# Patient Record
Sex: Female | Born: 1955 | Race: White | Hispanic: No | Marital: Married | Smoking: Never smoker
Health system: Southern US, Community
[De-identification: ages and names within clinical notes are randomized; demographics above are authoritative.]

## PROBLEM LIST (undated history)

## (undated) DIAGNOSIS — N6099 Unspecified benign mammary dysplasia of unspecified breast: Secondary | ICD-10-CM

## (undated) DIAGNOSIS — C44311 Basal cell carcinoma of skin of nose: Secondary | ICD-10-CM

## (undated) DIAGNOSIS — C50919 Malignant neoplasm of unspecified site of unspecified female breast: Secondary | ICD-10-CM

## (undated) DIAGNOSIS — K219 Gastro-esophageal reflux disease without esophagitis: Secondary | ICD-10-CM

## (undated) DIAGNOSIS — T4145XA Adverse effect of unspecified anesthetic, initial encounter: Secondary | ICD-10-CM

## (undated) DIAGNOSIS — Z87828 Personal history of other (healed) physical injury and trauma: Secondary | ICD-10-CM

## (undated) DIAGNOSIS — T8859XA Other complications of anesthesia, initial encounter: Secondary | ICD-10-CM

## (undated) DIAGNOSIS — G43909 Migraine, unspecified, not intractable, without status migrainosus: Secondary | ICD-10-CM

## (undated) DIAGNOSIS — C4401 Basal cell carcinoma of skin of lip: Secondary | ICD-10-CM

## (undated) DIAGNOSIS — F32A Depression, unspecified: Secondary | ICD-10-CM

## (undated) DIAGNOSIS — F329 Major depressive disorder, single episode, unspecified: Secondary | ICD-10-CM

## (undated) HISTORY — PX: LUMBAR MICRODISCECTOMY: SHX99

## (undated) HISTORY — PX: BASAL CELL CARCINOMA EXCISION: SHX1214

---
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
## 1988-08-18 HISTORY — PX: AXILLARY LYMPH NODE DISSECTION: SHX5229

## 1997-10-27 ENCOUNTER — Ambulatory Visit (HOSPITAL_COMMUNITY): Admission: RE | Admit: 1997-10-27 | Discharge: 1997-10-27 | Payer: Self-pay | Admitting: Obstetrics and Gynecology

## 1997-11-06 ENCOUNTER — Ambulatory Visit (HOSPITAL_COMMUNITY): Admission: RE | Admit: 1997-11-06 | Discharge: 1997-11-06 | Payer: Self-pay | Admitting: Obstetrics and Gynecology

## 1997-12-01 ENCOUNTER — Ambulatory Visit (HOSPITAL_COMMUNITY): Admission: RE | Admit: 1997-12-01 | Discharge: 1997-12-01 | Payer: Self-pay | Admitting: Surgery

## 1998-04-04 ENCOUNTER — Ambulatory Visit (HOSPITAL_COMMUNITY): Admission: RE | Admit: 1998-04-04 | Discharge: 1998-04-05 | Payer: Self-pay | Admitting: Specialist

## 1998-04-04 ENCOUNTER — Encounter: Payer: Self-pay | Admitting: Specialist

## 1998-08-18 DIAGNOSIS — C4401 Basal cell carcinoma of skin of lip: Secondary | ICD-10-CM

## 1998-08-18 HISTORY — DX: Basal cell carcinoma of skin of lip: C44.01

## 1999-05-13 ENCOUNTER — Other Ambulatory Visit: Admission: RE | Admit: 1999-05-13 | Discharge: 1999-05-13 | Payer: Self-pay | Admitting: Obstetrics and Gynecology

## 2000-07-21 ENCOUNTER — Other Ambulatory Visit: Admission: RE | Admit: 2000-07-21 | Discharge: 2000-07-21 | Payer: Self-pay | Admitting: Obstetrics and Gynecology

## 2000-07-30 ENCOUNTER — Other Ambulatory Visit: Admission: RE | Admit: 2000-07-30 | Discharge: 2000-07-30 | Payer: Self-pay | Admitting: Obstetrics and Gynecology

## 2000-07-30 ENCOUNTER — Encounter (INDEPENDENT_AMBULATORY_CARE_PROVIDER_SITE_OTHER): Payer: Self-pay

## 2000-12-24 ENCOUNTER — Encounter: Payer: Self-pay | Admitting: Obstetrics and Gynecology

## 2000-12-24 ENCOUNTER — Ambulatory Visit (HOSPITAL_COMMUNITY): Admission: RE | Admit: 2000-12-24 | Discharge: 2000-12-24 | Payer: Self-pay | Admitting: Obstetrics and Gynecology

## 2001-03-06 ENCOUNTER — Ambulatory Visit (HOSPITAL_COMMUNITY): Admission: RE | Admit: 2001-03-06 | Discharge: 2001-03-06 | Payer: Self-pay | Admitting: Family Medicine

## 2001-03-06 ENCOUNTER — Encounter: Payer: Self-pay | Admitting: Family Medicine

## 2002-01-20 ENCOUNTER — Ambulatory Visit (HOSPITAL_COMMUNITY): Admission: RE | Admit: 2002-01-20 | Discharge: 2002-01-20 | Payer: Self-pay | Admitting: Obstetrics and Gynecology

## 2002-01-20 ENCOUNTER — Encounter: Payer: Self-pay | Admitting: Obstetrics and Gynecology

## 2003-10-05 ENCOUNTER — Other Ambulatory Visit: Admission: RE | Admit: 2003-10-05 | Discharge: 2003-10-05 | Payer: Self-pay | Admitting: Obstetrics and Gynecology

## 2003-12-26 ENCOUNTER — Ambulatory Visit (HOSPITAL_COMMUNITY): Admission: RE | Admit: 2003-12-26 | Discharge: 2003-12-26 | Payer: Self-pay | Admitting: Obstetrics and Gynecology

## 2004-10-07 ENCOUNTER — Other Ambulatory Visit: Admission: RE | Admit: 2004-10-07 | Discharge: 2004-10-07 | Payer: Self-pay | Admitting: Obstetrics and Gynecology

## 2005-01-08 ENCOUNTER — Ambulatory Visit (HOSPITAL_COMMUNITY): Admission: RE | Admit: 2005-01-08 | Discharge: 2005-01-08 | Payer: Self-pay | Admitting: Obstetrics and Gynecology

## 2005-10-07 ENCOUNTER — Other Ambulatory Visit: Admission: RE | Admit: 2005-10-07 | Discharge: 2005-10-07 | Payer: Self-pay | Admitting: Obstetrics and Gynecology

## 2006-01-13 ENCOUNTER — Encounter: Admission: RE | Admit: 2006-01-13 | Discharge: 2006-01-13 | Payer: Self-pay | Admitting: Obstetrics and Gynecology

## 2006-10-02 ENCOUNTER — Encounter: Admission: RE | Admit: 2006-10-02 | Discharge: 2006-10-02 | Payer: Self-pay | Admitting: Family Medicine

## 2007-02-08 ENCOUNTER — Ambulatory Visit (HOSPITAL_COMMUNITY): Admission: RE | Admit: 2007-02-08 | Discharge: 2007-02-08 | Payer: Self-pay | Admitting: Obstetrics and Gynecology

## 2007-02-10 ENCOUNTER — Ambulatory Visit (HOSPITAL_COMMUNITY): Admission: RE | Admit: 2007-02-10 | Discharge: 2007-02-10 | Payer: Self-pay | Admitting: Obstetrics and Gynecology

## 2007-09-23 ENCOUNTER — Encounter: Admission: RE | Admit: 2007-09-23 | Discharge: 2007-09-23 | Payer: Self-pay | Admitting: Neurosurgery

## 2007-10-04 ENCOUNTER — Ambulatory Visit (HOSPITAL_COMMUNITY): Admission: RE | Admit: 2007-10-04 | Discharge: 2007-10-05 | Payer: Self-pay | Admitting: Neurosurgery

## 2008-02-14 ENCOUNTER — Ambulatory Visit (HOSPITAL_COMMUNITY): Admission: RE | Admit: 2008-02-14 | Discharge: 2008-02-14 | Payer: Self-pay | Admitting: Family Medicine

## 2008-12-06 ENCOUNTER — Other Ambulatory Visit: Admission: RE | Admit: 2008-12-06 | Discharge: 2008-12-06 | Payer: Self-pay | Admitting: Obstetrics and Gynecology

## 2009-02-15 ENCOUNTER — Ambulatory Visit (HOSPITAL_COMMUNITY): Admission: RE | Admit: 2009-02-15 | Discharge: 2009-02-15 | Payer: Self-pay | Admitting: Obstetrics and Gynecology

## 2009-02-23 ENCOUNTER — Encounter: Admission: RE | Admit: 2009-02-23 | Discharge: 2009-02-23 | Payer: Self-pay | Admitting: Obstetrics and Gynecology

## 2009-10-15 ENCOUNTER — Encounter: Admission: RE | Admit: 2009-10-15 | Discharge: 2009-10-15 | Payer: Self-pay | Admitting: Obstetrics and Gynecology

## 2010-03-21 ENCOUNTER — Encounter: Admission: RE | Admit: 2010-03-21 | Discharge: 2010-03-21 | Payer: Self-pay | Admitting: Obstetrics and Gynecology

## 2010-03-26 ENCOUNTER — Encounter: Admission: RE | Admit: 2010-03-26 | Discharge: 2010-03-26 | Payer: Self-pay | Admitting: Obstetrics and Gynecology

## 2010-04-03 ENCOUNTER — Encounter: Admission: RE | Admit: 2010-04-03 | Discharge: 2010-04-03 | Payer: Self-pay | Admitting: Obstetrics and Gynecology

## 2010-04-07 DIAGNOSIS — Z853 Personal history of malignant neoplasm of breast: Secondary | ICD-10-CM

## 2010-04-12 ENCOUNTER — Ambulatory Visit: Payer: Self-pay | Admitting: Oncology

## 2010-04-18 LAB — COMPREHENSIVE METABOLIC PANEL
ALT: 14 U/L (ref 0–35)
Albumin: 4.4 g/dL (ref 3.5–5.2)
Alkaline Phosphatase: 39 U/L (ref 39–117)
Calcium: 9.1 mg/dL (ref 8.4–10.5)
Chloride: 100 mEq/L (ref 96–112)
Creatinine, Ser: 0.69 mg/dL (ref 0.40–1.20)
Total Bilirubin: 0.9 mg/dL (ref 0.3–1.2)
Total Protein: 8 g/dL (ref 6.0–8.3)

## 2010-04-18 LAB — CBC WITH DIFFERENTIAL/PLATELET
BASO%: 0.6 % (ref 0.0–2.0)
EOS%: 0.9 % (ref 0.0–7.0)
HCT: 39 % (ref 34.8–46.6)
LYMPH%: 39.7 % (ref 14.0–49.7)
MCH: 32 pg (ref 25.1–34.0)
MCHC: 33.3 g/dL (ref 31.5–36.0)
MCV: 96.1 fL (ref 79.5–101.0)
MONO#: 0.8 10*3/uL (ref 0.1–0.9)
MONO%: 11.4 % (ref 0.0–14.0)
NEUT%: 47.4 % (ref 38.4–76.8)
Platelets: 273 10*3/uL (ref 145–400)

## 2010-05-18 HISTORY — PX: MASTECTOMY: SHX3

## 2010-06-06 ENCOUNTER — Encounter: Admission: RE | Admit: 2010-06-06 | Discharge: 2010-06-06 | Payer: Self-pay | Admitting: Surgery

## 2010-06-11 ENCOUNTER — Encounter (INDEPENDENT_AMBULATORY_CARE_PROVIDER_SITE_OTHER): Payer: Self-pay | Admitting: Surgery

## 2010-06-11 ENCOUNTER — Inpatient Hospital Stay (HOSPITAL_COMMUNITY): Admission: RE | Admit: 2010-06-11 | Discharge: 2010-06-12 | Payer: Self-pay | Admitting: Surgery

## 2010-08-14 ENCOUNTER — Ambulatory Visit: Payer: Self-pay | Admitting: Oncology

## 2010-08-20 LAB — CBC WITH DIFFERENTIAL/PLATELET
Basophils Absolute: 0 10*3/uL (ref 0.0–0.1)
EOS%: 1.2 % (ref 0.0–7.0)
MCH: 31.6 pg (ref 25.1–34.0)
MCHC: 33.8 g/dL (ref 31.5–36.0)
MONO#: 0.8 10*3/uL (ref 0.1–0.9)
RBC: 4.12 10*6/uL (ref 3.70–5.45)
RDW: 13.1 % (ref 11.2–14.5)
WBC: 7.3 10*3/uL (ref 3.9–10.3)

## 2010-10-09 ENCOUNTER — Encounter (HOSPITAL_COMMUNITY)
Admission: RE | Admit: 2010-10-09 | Discharge: 2010-10-09 | Disposition: A | Discharge status: Home / Self Care | Payer: BC Managed Care – PPO | Source: Ambulatory Visit | Attending: Plastic Surgery | Admitting: Plastic Surgery

## 2010-10-09 DIAGNOSIS — Z01812 Encounter for preprocedural laboratory examination: Secondary | ICD-10-CM | POA: Insufficient documentation

## 2010-10-09 LAB — CBC
MCH: 32.1 pg (ref 26.0–34.0)
MCHC: 33.7 g/dL (ref 30.0–36.0)
WBC: 8.7 10*3/uL (ref 4.0–10.5)

## 2010-10-09 LAB — SURGICAL PCR SCREEN
MRSA, PCR: NEGATIVE
Staphylococcus aureus: NEGATIVE

## 2010-10-14 ENCOUNTER — Inpatient Hospital Stay (HOSPITAL_COMMUNITY)
Admission: RE | Admit: 2010-10-14 | Discharge: 2010-10-17 | DRG: 261 | Disposition: A | Discharge status: Home / Self Care | Payer: BC Managed Care – PPO | Source: Ambulatory Visit | Attending: Plastic Surgery | Admitting: Plastic Surgery

## 2010-10-14 ENCOUNTER — Other Ambulatory Visit: Payer: Self-pay | Admitting: Plastic Surgery

## 2010-10-14 DIAGNOSIS — Z853 Personal history of malignant neoplasm of breast: Secondary | ICD-10-CM

## 2010-10-14 DIAGNOSIS — Z421 Encounter for breast reconstruction following mastectomy: Principal | ICD-10-CM

## 2010-10-14 DIAGNOSIS — Z9010 Acquired absence of unspecified breast and nipple: Secondary | ICD-10-CM

## 2010-10-14 HISTORY — PX: OTHER SURGICAL HISTORY: SHX169

## 2010-10-30 LAB — CBC
HCT: 41.8 % (ref 36.0–46.0)
MCHC: 33 g/dL (ref 30.0–36.0)
MCV: 97 fL (ref 78.0–100.0)
Platelets: 327 10*3/uL (ref 150–400)
RDW: 13.1 % (ref 11.5–15.5)

## 2010-10-30 LAB — URINALYSIS, ROUTINE W REFLEX MICROSCOPIC
Ketones, ur: NEGATIVE mg/dL
Nitrite: NEGATIVE
Specific Gravity, Urine: 1.02 (ref 1.005–1.030)
pH: 7.5 (ref 5.0–8.0)

## 2010-10-30 LAB — DIFFERENTIAL
Lymphocytes Relative: 40 % (ref 12–46)
Lymphs Abs: 2.9 10*3/uL (ref 0.7–4.0)
Monocytes Relative: 11 % (ref 3–12)
Neutro Abs: 3.6 10*3/uL (ref 1.7–7.7)
Neutrophils Relative %: 48 % (ref 43–77)

## 2010-10-30 LAB — COMPREHENSIVE METABOLIC PANEL
BUN: 8 mg/dL (ref 6–23)
Calcium: 9.4 mg/dL (ref 8.4–10.5)
Glucose, Bld: 81 mg/dL (ref 70–99)
Total Protein: 7.4 g/dL (ref 6.0–8.3)

## 2010-10-30 LAB — SURGICAL PCR SCREEN: MRSA, PCR: NEGATIVE

## 2010-11-07 NOTE — Discharge Summary (Signed)
  Ariel Mccall, Ariel Mccall              ACCOUNT NO.:  [id number]  MEDICAL RECORD NO.:  [id number]           PATIENT TYPE:  I  LOCATION:  5155                         FACILITY:  [facility]  PHYSICIAN:  Etter Sjogren, M.D.     DATE OF BIRTH:  1956-05-15  DATE OF ADMISSION:  10/14/2010 DATE OF DISCHARGE:  10/17/2010                              DISCHARGE SUMMARY   FINAL DIAGNOSIS:  Breast cancer.  PROCEDURE: 1. Right breast reconstruction with an ipsilateral transverse rectus     abdominis myocutaneous flap. 2. Reconstruction of abdominal wall with mesh.  HISTORY OF PRESENT ILLNESS:  A 55 year old woman has had mastectomy, had delay of the TRAM flap, and now presents for her reconstruction with the TRAM flap.  The nature of the procedure, the risks, plus complications were discussed with her in great detail and she understood those risks and wished to proceed.  For further details of history and physical, please see the chart.  COURSE IN THE HOSPITAL:  On admission, hemoglobin and hematocrit within normal limits.  She was taken to surgery, at which time the TRAM flap and the abdominal wall reconstruction with mesh were performed.  She tolerated that procedure well.  Postoperatively, she did very well. Flap had excellent color.  Capillary refill 1-2 seconds.  Abdomen and distal scan also viable.  Cap refill 1-2 seconds.  Good color.  No evidence any bleeding.  No evidence of any cellulitis.  She was ambulating on the first postoperative day, tolerating liquids.  She did complain of some headache that gradually resolved.  She was continued on IV antibiotics for a couple of days and diet was advanced to regular diet, tolerated that well.  On third day, no evidence any bleeding or infection.  No nausea.  Good pain control.  Headache essentially resolved.  Flap abdomen looked great.  No evidence of any vascular compromise.  Capillary refill 1-2 seconds.  Thin drainage and functioning and felt  she should be discharged.  DISPOSITION:  Regular diet.  No lifting.  No exercising.  No vigorous activities.  No showers.  Empty the drains 3 times every milliliter. Use spirometer 10 times a day at home.  Follow up with me in the office next week.  MEDICATION:  Percocet, total of 40, one p.o. q.4, two p.o. q.6 p.r.n. for pain.  CONDITION:  Good.     Etter Sjogren, M.D.     DB/MEDQ  D:  10/17/2010  T:  10/18/2010  Job:  [id number]  Electronically Signed by Etter Sjogren M.D. on 11/07/2010 05:08:13 PM

## 2010-11-07 NOTE — Op Note (Signed)
NAMEJOI, LEYVA              ACCOUNT NO.:  [id number]  MEDICAL RECORD NO.:  [id number]           PATIENT TYPE:  I  LOCATION:  5155                         FACILITY:  [facility]  PHYSICIAN:  Etter Sjogren, M.D.     DATE OF BIRTH:  1956/08/05  DATE OF PROCEDURE:  10/14/2010 DATE OF DISCHARGE:                              OPERATIVE REPORT   PREOPERATIVE DIAGNOSIS:  Right breast cancer.  POSTOPERATIVE DIAGNOSIS:  Right breast cancer.  PROCEDURE PERFORMED: 1. Right breast reconstruction with an ipsilateral transverse rectus     abdominis myocutaneous flap. 2. Reconstruction of abdominal wall with mesh.  SURGEON:  Etter Sjogren, MD.  ASSISTANT:  Pleas Patricia, MD.  ANESTHESIA:  General.  ESTIMATED BLOOD LOSS:  150 mL.  DRAINS:  One 19-French for the chest, two 19-French for the abdomen.  CLINICAL NOTE:  A 55 year old woman who has had right breast cancer, desires reconstruction.  She had delay of her TRAM flap at the time of the mastectomy, now presents for the transfer of the TRAM flap.  Next, procedure risks plus complications were discussed with her in great detail, risks included but not limited to bleeding, infection, anesthesia complications, healing problems, scarring, fluid collections, loss of sensation, loss of the flap, loss of fatty tissue, loss of skin, loss of tissue on the abdomen, damage to deeper structures, damage to intra-abdominal contents, weakness of the abdomen, hernia, asymmetry, disappointment, and pulmonary embolism, and she understood all this and wished to proceed.  DESCRIPTION OF PROCEDURE:  The patient was marked in the holding area for the TRAM reconstruction at the recipient site for inframammary fold and areas to be dissected at the recipient area and on the abdomen the flap was marked.  She was taken to the operating room and placed supine. After successful general anesthesia, she was prepped with ChloraPrep. After waiting for the full 3  minutes for drying, she was draped with sterile drapes.  The old mastectomy scar was excised and the specimen was sent to pathology.  The inferior and superior mastectomy flaps were elevated to the preoperative markings, meticulous hemostasis with electrocautery.  A moist lap then placed in this space and attention was then directed to the abdomen.  An incision was made around the umbilicus and generous fatty stalk was left on the left-hand side.  This is the non-pedicle side in order to ensure viability of the umbilicus and the umbilicus had been dissected free.  The upper limb of the planned abdominal incision was then made and the dissection carried down through the subcutaneous tissue straight down on the left side, beveling cephalad on the right in order to capture more perforators overlying the muscle.  The dissection continued up to the xiphoid region and the subcutaneous tunnel made connecting through to the mastectomy defect.  The lower limb of the incision was then made, carried down through the underlying the fascia, and then the left side of the flap lifted over the midline and the right side lifted over to the lateral row of perforators.  Parallel incision was then made in the fascia, leaving a 2 cm wide strip of fascia  on the overlying muscle ,and the muscle was gently dissected free from its surrounding fascial attachments using both the bipolar cautery and a scalpel.  Great care was taken to avoid damage to the underlying muscle and great care taken to the tendinous inscriptions.  Lateral intercostal perforators were mobilized and double ligated with Ligaclips and divided, and the muscle was freed posteriorly as well and the fascia, taking great care to avoid any damage to underlying intra-abdominal contents and the deep inferior epigastric vessels were identified and double ligated and divided.  Muscle was divided.  The mastectomy space was thoroughly irrigated with  saline, meticulous hemostasis with electrocautery.  Flap was transferred.  It had excellent color, bright red bleeding around its periphery consistent with viability, transferred gently through the subcutaneous tunnel and temporally secured with skin staples.  A 19- French drain was positioned and brought through separate stab wound inferolaterally and secured with 3-0 Prolene suture.  Attention was then directed to the abdomen.  Thorough irrigation with saline.  Meticulous hemostasis with electrocautery.  Muscle stump also inspected and hemostasis with electrocautery.  The fascia was then closed with 0 Prolene interrupted figure-of-eight sutures.  Great care was taken to avoid any damage to the underlying intra-abdominal contents, and then irrigation with saline, and [place] 6-inch mesh was placed in the umbilicus and brought through this and cut in the mesh, and then the mesh was secured with a running 2-0 Prolene suture around its periphery.  A 19-French drain was positioned, brought through separate stab wounds inferiorly and secured with 3-0 Prolene sutures. The patient placed in the semi-Fowler position and the abdominal closure was performed using 2-0 PDS interrupted deep, 3-0 Monocryl with inverted deep dermal, and running 3-0 Monocryl subcuticular suture.  Antibiotic solution was placed inside the space and allowed to dwell over this mesh just prior to completing this closure.  Incision was made as close to the midline as possible. The midline had been marked preoperatively and umbilicus was pulled as far possible back towards the midline towards the left and the umbilicus brought through this opening and was found to have excellent color and appeared to be viable.  Umbilicus was then set with 3-0 Monocryl interrupted deep dermal sutures and simple interrupted 4-0 Prolene sutures.  Attention was then directed to the chest, the flap looked very good, it was marked for  de-epithelialization.  De-epithelialization was performed.  A portion of zone 4 was removed in fact all of zone 4 was removed and there was bright red bleeding at the incision site consistent with viability and the tip of zone 2 was removed and it also had bright red bleeding there as well and the flap appeared very healthy and good color.  De-epithelialization was performed as marked and thorough irrigation with saline.  Excellent hemostasis was confirmed and the flap was inset with some 3-0 Monocryl inverted deep dermal sutures and running 3-0 Monocryl subcuticular suture.  Dermabond was then applied.  Dry sterile dressings applied over the drains and over the umbilicus, and light dry sterile dressing placed over the abdominal incision, and she was transferred to the recovery room stable, having tolerated the procedure well.     Etter Sjogren, M.D.     DB/MEDQ  D:  10/14/2010  T:  10/15/2010  Job:  [id number]  Electronically Signed by Etter Sjogren M.D. on 11/07/2010 43:32:95 PM

## 2010-12-10 ENCOUNTER — Ambulatory Visit: Payer: BC Managed Care – PPO | Attending: Oncology | Admitting: Physical Therapy

## 2010-12-10 DIAGNOSIS — M24519 Contracture, unspecified shoulder: Secondary | ICD-10-CM | POA: Insufficient documentation

## 2010-12-10 DIAGNOSIS — IMO0001 Reserved for inherently not codable concepts without codable children: Secondary | ICD-10-CM | POA: Insufficient documentation

## 2010-12-10 DIAGNOSIS — Z853 Personal history of malignant neoplasm of breast: Secondary | ICD-10-CM | POA: Insufficient documentation

## 2010-12-30 ENCOUNTER — Ambulatory Visit: Payer: BC Managed Care – PPO | Attending: Oncology | Admitting: Physical Therapy

## 2010-12-30 DIAGNOSIS — Z853 Personal history of malignant neoplasm of breast: Secondary | ICD-10-CM | POA: Insufficient documentation

## 2010-12-30 DIAGNOSIS — IMO0001 Reserved for inherently not codable concepts without codable children: Secondary | ICD-10-CM | POA: Insufficient documentation

## 2010-12-30 DIAGNOSIS — M24519 Contracture, unspecified shoulder: Secondary | ICD-10-CM | POA: Insufficient documentation

## 2010-12-31 NOTE — Op Note (Signed)
Ariel Mccall, Ariel Mccall              ACCOUNT NO.:  [id number]   MEDICAL RECORD NO.:  [id number]          PATIENT TYPE:  OIB   LOCATION:  3599                         FACILITY:  [facility]   PHYSICIAN:  Clydene Fake, M.D.  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   PROCEDURE:  Redo right L4-5 semihemilaminectomy and diskectomy,  microdissection with microscope.   SURGEON:  Clydene Fake, M.D.   ASSISTANT:  Danae Orleans. Venetia Maxon, M.D.   ANESTHESIA:  General endotracheal tube anesthesia.   ESTIMATED BLOOD LOSS:  Minimal.   BLOOD REPLACED:  None.   DRAINS:  None.   COMPLICATIONS:  None.   REASON FOR PROCEDURE:  The patient a 55 year old woman who has had  significant right leg pain, disk herniation large right side L4-5 site  of previous surgery and  larger than when she had an MRI one year previous.  The patient brought  in for decompression.   PROCEDURE IN DETAIL:  The patient was brought into the operating room,  general anesthesia was induced.  The patient was placed a prone position  on Wilson frame with all pressure points padded.  The patient was  prepped, draped in usual sterile fashion.  Site of incision was injected  with 10 mL of 1% lidocaine with epinephrine.  Incision was then made at  site of previous scar, incision taken to the fascia.  Hemostasis  obtained with Bovie cauterization.  The fascia was incised and  subperiosteal dissection done over the L4 and 5 spinous process out to  the lamina, self-retaining retractor was placed.  Marker was placed in  the interspace and x-ray confirmed our positioning at the L4-5 level.  Microscope was brought in for microdissection.  At this point a high-  speed drill was used to start  semihemilaminectomy.  We carefully  dissected through scar tissue to the epidural space, dissected down to  the disk space, found subligamentous disk herniation as we started  freeing up the scar of nerve root over the disk space.  Free fragments  of disk were encountered and these were evacuated.  This decompressed  thecal sac and the 5 nerve root.  We were able to explore the disk space  much easier.  We found a large rent through the annulus into the disk  space.  Disk space was then incised with 15 blade, diskectomy performed  with pituitary rongeurs and curettes.  When we were finished we had good  decompression of central canal and nerve roots, the five roots were well  decompressed.  We had good hemostasis, irrigated with antibiotic  solution and then placed fentanyl and Depo-Medrol into the epidural  space and removed the retractors.  Fascia closed with 0-0  Vicryl  interrupted sutures.  Subcutaneous tissue  closed with 0-0, 2-0 and 3-0 Vicryl interrupted sutures.  Skin closed  with benzoin, Steri-Strips.  Dressing was placed.  Patient was placed  back into supine position, awoke from  anesthesia, transferred to  recovery in stable condition.           ______________________________  Clydene Fake, M.D.     JRH/MEDQ  D:  10/04/2007  T:  10/05/2007  Job:  [id number]

## 2011-03-28 ENCOUNTER — Other Ambulatory Visit (INDEPENDENT_AMBULATORY_CARE_PROVIDER_SITE_OTHER): Payer: Self-pay | Admitting: Surgery

## 2011-03-28 DIAGNOSIS — Z9011 Acquired absence of right breast and nipple: Secondary | ICD-10-CM

## 2011-04-03 ENCOUNTER — Ambulatory Visit: Payer: BC Managed Care – PPO

## 2011-04-11 ENCOUNTER — Ambulatory Visit
Admission: RE | Admit: 2011-04-11 | Discharge: 2011-04-11 | Disposition: A | Discharge status: Home / Self Care | Payer: BC Managed Care – PPO | Source: Ambulatory Visit | Attending: Surgery | Admitting: Surgery

## 2011-04-11 DIAGNOSIS — Z9011 Acquired absence of right breast and nipple: Secondary | ICD-10-CM

## 2011-05-01 ENCOUNTER — Encounter (HOSPITAL_BASED_OUTPATIENT_CLINIC_OR_DEPARTMENT_OTHER): Payer: BC Managed Care – PPO | Admitting: Oncology

## 2011-05-01 ENCOUNTER — Other Ambulatory Visit: Payer: Self-pay | Admitting: Oncology

## 2011-05-01 DIAGNOSIS — C50919 Malignant neoplasm of unspecified site of unspecified female breast: Secondary | ICD-10-CM

## 2011-05-01 LAB — COMPREHENSIVE METABOLIC PANEL
ALT: 9 U/L (ref 0–35)
AST: 15 U/L (ref 0–37)
Albumin: 3.9 g/dL (ref 3.5–5.2)
BUN: 17 mg/dL (ref 6–23)
Calcium: 9.2 mg/dL (ref 8.4–10.5)
Chloride: 103 mEq/L (ref 96–112)
Potassium: 3.8 mEq/L (ref 3.5–5.3)

## 2011-05-01 LAB — CBC WITH DIFFERENTIAL/PLATELET
Basophils Absolute: 0 10*3/uL (ref 0.0–0.1)
EOS%: 1.1 % (ref 0.0–7.0)
HGB: 12.3 g/dL (ref 11.6–15.9)
MCH: 33.3 pg (ref 25.1–34.0)
NEUT#: 2.4 10*3/uL (ref 1.5–6.5)
RDW: 13.6 % (ref 11.2–14.5)
lymph#: 2.5 10*3/uL (ref 0.9–3.3)

## 2011-05-08 ENCOUNTER — Encounter (HOSPITAL_BASED_OUTPATIENT_CLINIC_OR_DEPARTMENT_OTHER): Payer: BC Managed Care – PPO | Admitting: Oncology

## 2011-05-08 DIAGNOSIS — C50919 Malignant neoplasm of unspecified site of unspecified female breast: Secondary | ICD-10-CM

## 2011-05-08 DIAGNOSIS — Z9010 Acquired absence of unspecified breast and nipple: Secondary | ICD-10-CM

## 2011-05-09 ENCOUNTER — Encounter (INDEPENDENT_AMBULATORY_CARE_PROVIDER_SITE_OTHER): Payer: Self-pay | Admitting: Surgery

## 2011-05-09 LAB — URINALYSIS, ROUTINE W REFLEX MICROSCOPIC
Bilirubin Urine: NEGATIVE
Ketones, ur: NEGATIVE
Nitrite: NEGATIVE
Urobilinogen, UA: 0.2

## 2011-05-09 LAB — CBC
Platelets: 268
RDW: 13.2

## 2011-05-09 LAB — PROTIME-INR: INR: 1

## 2011-05-09 LAB — BASIC METABOLIC PANEL
BUN: 16
Calcium: 9
Creatinine, Ser: 0.53
GFR calc non Af Amer: >60
Glucose, Bld: 100 — ABNORMAL HIGH

## 2011-05-09 LAB — APTT: aPTT: 23 — ABNORMAL LOW

## 2011-10-13 ENCOUNTER — Telehealth: Payer: Self-pay | Admitting: *Deleted

## 2011-10-13 NOTE — Telephone Encounter (Signed)
patient confirmed over the phone of the new date and time

## 2011-10-29 ENCOUNTER — Other Ambulatory Visit (HOSPITAL_BASED_OUTPATIENT_CLINIC_OR_DEPARTMENT_OTHER): Payer: BC Managed Care – PPO | Admitting: Lab

## 2011-10-29 DIAGNOSIS — C50919 Malignant neoplasm of unspecified site of unspecified female breast: Secondary | ICD-10-CM

## 2011-10-29 LAB — CBC WITH DIFFERENTIAL/PLATELET
Basophils Absolute: 0 10*3/uL (ref 0.0–0.1)
EOS%: 0.7 % (ref 0.0–7.0)
HGB: 12.1 g/dL (ref 11.6–15.9)
LYMPH%: 39.6 % (ref 14.0–49.7)
MCH: 32.5 pg (ref 25.1–34.0)
MCV: 96.5 fL (ref 79.5–101.0)
MONO%: 8.9 % (ref 0.0–14.0)
Platelets: 250 10*3/uL (ref 145–400)
RBC: 3.72 10*6/uL (ref 3.70–5.45)
RDW: 13.3 % (ref 11.2–14.5)

## 2011-10-29 LAB — COMPREHENSIVE METABOLIC PANEL
AST: 19 U/L (ref 0–37)
Albumin: 4.1 g/dL (ref 3.5–5.2)
Alkaline Phosphatase: 45 U/L (ref 39–117)
BUN: 18 mg/dL (ref 6–23)
Potassium: 3.7 mEq/L (ref 3.5–5.3)
Total Bilirubin: 0.5 mg/dL (ref 0.3–1.2)

## 2011-11-05 ENCOUNTER — Ambulatory Visit: Payer: BC Managed Care – PPO | Admitting: Physician Assistant

## 2011-11-05 ENCOUNTER — Other Ambulatory Visit: Payer: BC Managed Care – PPO | Admitting: Lab

## 2011-11-18 ENCOUNTER — Ambulatory Visit (HOSPITAL_BASED_OUTPATIENT_CLINIC_OR_DEPARTMENT_OTHER): Payer: BC Managed Care – PPO | Admitting: Physician Assistant

## 2011-11-18 ENCOUNTER — Telehealth: Payer: Self-pay | Admitting: *Deleted

## 2011-11-18 ENCOUNTER — Encounter: Payer: Self-pay | Admitting: Physician Assistant

## 2011-11-18 VITALS — BP 131/82 | HR 71 | Temp 97.8°F | Ht 62.5 in | Wt 149.4 lb

## 2011-11-18 DIAGNOSIS — Z1231 Encounter for screening mammogram for malignant neoplasm of breast: Secondary | ICD-10-CM

## 2011-11-18 DIAGNOSIS — Z9010 Acquired absence of unspecified breast and nipple: Secondary | ICD-10-CM

## 2011-11-18 DIAGNOSIS — Z171 Estrogen receptor negative status [ER-]: Secondary | ICD-10-CM

## 2011-11-18 DIAGNOSIS — C50919 Malignant neoplasm of unspecified site of unspecified female breast: Secondary | ICD-10-CM

## 2011-11-18 DIAGNOSIS — Z853 Personal history of malignant neoplasm of breast: Secondary | ICD-10-CM

## 2011-11-18 NOTE — Telephone Encounter (Signed)
made patient appointment at the breast center on 04-16-2012 at 3:30 pm breast center will call the patient to make the mri of the breast

## 2011-11-18 NOTE — Progress Notes (Signed)
ID: Ariel Mccall   DOB: 1955-09-03  MR#: [id number]  CSN#:[id number]  HISTORY OF PRESENT ILLNESS: Ariel Mccall had routine screening mammography 02/14/2009 at [facility] showing a possible mass in the right breast.  Additional studies on 07/09 showed no areas of persistent distortion, mass, or new calcifications, and by exam Dr. Judyann Munson was not able to palpate a mass in the right breast.  Ultrasound of the right breast showed normal tissue without again any persistent distortion, mass, or shadowing.    The patient had a repeat diagnostic mammography 10/15/2009 as a "76-month" followup.  Dr. Jean Rosenthal on this view found that the breasts were heterogeneously dense.  This of course reduces mammographic sensitivity.  The area of questionable distortion noted on the screening study from 01/2009 was not seen on this study.  There was a small cluster of stable, benign appearing calcifications centrally and slightly laterally in the right breast.  The patient was then recalled for a further 8-month followup on 03/21/2010.  On the 08/04 mammogram, again dense breasts were noted, but now there was an area of distortion in the lateral/central aspect of the right breast.  On physical examination, Dr. Guinevere Ferrari did not palpate any abnormality.  Ultrasound, however, showed a hypoechoic irregular mass with spiculated margins at the 10 o'clock position 4 mm from the nipple, measuring a total of 1.1 cm, corresponding to the area seen on mammography.  The axillae were unremarkable.    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  With this information the patient was referred to Dr. Jamey Ripa, and bilateral breast MRIs were obtained 08/09.  The area of biopsy with proven carcinoma in the right breast did not stand out significantly from background.   However, a mass-like area of distortion could be seen in the middle third of the upper central to upper outer right breast.  This was felt to measure up to 2.2 cm, and did contain the clip.  More posteriorly there was asymmetric area of confluent non-mass-like enhancement that measured another 2 cm.  Again there was no evidence of axillary or internal mammary chain adenopathy.  Because the second area in the upper outer right breast had already been evaluated by ultrasound and mammography without any abnormalities of consequence being seen, the patient was brought back on 08/17 for MRI guided biopsy of this area.  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  This in situ tumor was also estrogen and progesterone receptor negative.  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 Margins were ample. Patient underwent TRAM reconstruction in February of 2012. She decided to forego adjuvant therapy.  Of note patient is known to be BRCA1 and 2 negative, testing having been obtained at [facility].  INTERVAL HISTORY: Ariel Mccall returns today for routine six-month followup of her right breast carcinoma. 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  Her family is doing well. There has been some family stressors that Ariel Mccall has been dealing with with both her husband and  her daughter, but she seems to be coping well. She was diagnosed with depression and has been on Celexa since December which she finds very helpful. She is sleeping better at night. She feels much more relaxed. Even her reflux has decreased!  REVIEW OF SYSTEMS: Other than a recent upper respiratory  infection that was treated with a Z-Pak, Ariel Mccall has been feeling well.   She continues to have menstrual cycles, although the cycles are little longer than they used to be. Her last menstrual cycle was in late February 2013. She has occasional hot flashes. She still thinking about the possibility of a bilateral salpingo-oophorectomy, but has not yet made that decision. She is scheduled to meet with her gynecologist for her annual visit in May.  Ariel Mccall has had no nausea or change in bowel habits. No shortness of breath or chest pain. No abnormal headaches or unusual pain elsewhere.  In fact a detailed review of systems is otherwise noncontributory.   PAST MEDICAL HISTORY: Past Medical History  Diagnosis Date  . Hx breast cancer, IDC, Right, Grade I, triple - 04/07/2010    PAST SURGICAL HISTORY: History reviewed. No pertinent past surgical history.  FAMILY HISTORY History reviewed. No pertinent family history.  GYNECOLOGIC HISTORY:  She is GX, P2, first pregnancy to term age 73.  She is aware of course that this increases the risk of breast cancer developing.  The patient is still menstruating, and was taking hormones until 02/2009.  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  Her husband, Ariel Mccall, participates in a [employer]  Daughter, Ariel Mccall, 21, has a degree in psychology, and she participates in the [employer].  Ariel Mccall, Ariel Mccall, 19, is attending bible college as a sophomore.  The patient is a member of the [employer].   ADVANCED DIRECTIVES:  HEALTH MAINTENANCE: History  Substance Use Topics  . Smoking status: Never Smoker   . Smokeless tobacco: Not on file  . Alcohol Use: 0.0 oz/week    2-4 Glasses of wine per week     Colonoscopy:  PAP:  Bone density:  Lipid panel:  Allergies  Allergen Reactions  . Gadolinium      Code: SNEEZE, Desc: PT EXPERIENCED SNEEZING, SWELLING OF LIPS, COUGHING AND HIVES, GIVEN  PO BENADRYL, Onset Date: [id number]     Current Outpatient Prescriptions  Medication Sig Dispense Refill  . citalopram (CELEXA) 20 MG tablet Take 20 mg by mouth daily.      . Multiple Vitamin (MULTIVITAMIN) tablet Take 1 tablet by mouth daily.      . Calcium Carbonate-Vitamin D (CALCIUM-VITAMIN D) 500-200 MG-UNIT per tablet Take 1 tablet by mouth 2 (two) times daily with a meal.      . cholecalciferol (VITAMIN D) 400 UNITS TABS Take 400 Units by mouth daily.        OBJECTIVE: Filed Vitals:   11/18/11 1501  BP: 131/82  Pulse: 71  Temp: 97.8 F (36.6 C)     Body mass index is 26.89 kg/(m^2).    ECOG FS: 0   Physical Exam: HEENT:  Sclerae anicteric, conjunctivae pink.  Oropharynx clear.  No mucositis or candidiasis.   Nodes:  No cervical, supraclavicular, or axillary lymphadenopathy palpated.  Breast Exam:  Right breast is status post mastectomy with TRAM reconstruction. No suspicious nodularity or skin changes. No evidence of local recurrence. Left breast is benign although very dense. No suspicious masses, skin changes, or nipple inversion. Lungs:  Clear to auscultation bilaterally.  No crackles, rhonchi, or wheezes.   Heart:  Regular rate and rhythm.   Abdomen:  Soft, nontender.  Positive bowel sounds.  No organomegaly or masses palpated.   Musculoskeletal:  No focal spinal tenderness to palpation.  Extremities:  Benign.  No peripheral edema or cyanosis.   Skin:  Benign.   Neuro:  Nonfocal.    LAB RESULTS: Lab Results  Component Value Date   WBC 7.3 10/29/2011   NEUTROABS 3.7 10/29/2011   HGB 12.1 10/29/2011   HCT 35.9 10/29/2011   MCV 96.5 10/29/2011   PLT 250 10/29/2011      Chemistry      Component Value Date/Time   NA 137 10/29/2011 1609   K 3.7 10/29/2011 1609   CL 101 10/29/2011 1609   CO2 27 10/29/2011 1609   BUN 18 10/29/2011 1609   CREATININE 0.69 10/29/2011 1609      Component Value Date/Time   CALCIUM 9.2 10/29/2011 1609   ALKPHOS 45 10/29/2011 1609   AST 19  10/29/2011 1609   ALT 8 10/29/2011 1609   BILITOT 0.5 10/29/2011 1609       Lab Results  Component Value Date   LABCA2 17 10/29/2011    STUDIES: Most recent mammogram was in August 2012, showing very dense breasts, but no obvious abnormalities.   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  Margins were ample.   (2)  The patient has decided to forego adjuvant therapy.    (3)  She is known to be BRCA 1 and 2 negative (testing at [facility]).     PLAN: With regards to her breast cancer, Ariel Mccall is doing well, with no clinical evidence of disease recurrence. She returned for routine followup in 6 months, and prior to that visit will have her repeat mammogram as well as a breast MRI.  Patient voices understanding and agreement with this plan, and will call with any changes or problems prior to her next appointment.   Mabel Roll    11/18/2011

## 2011-12-31 ENCOUNTER — Telehealth: Payer: Self-pay | Admitting: *Deleted

## 2012-01-07 ENCOUNTER — Telehealth: Payer: Self-pay | Admitting: *Deleted

## 2012-01-07 NOTE — Telephone Encounter (Signed)
Pt called per follow up of need to be seen by GYN/ONC for consideration of hysterectomy.  Informed Debbie per review by MD and GYN coordinator surgery can be performed by primary MD and if needed request special studies per known history of breast ca. Otherwise if she desires consultation by above team she would need referral from her GYN and be seen in [facility].  Per conversation Eunice Blase states " that is kinda what the NP at my GYN said.Marland Kitchenthat the decision to do surgery was really up to me..I guess I was just hoping someone was going to say I needed to do it ".  Plan at present is pt will call primary GYN to discuss proceeding with surgery over the summer during school break.

## 2012-01-15 ENCOUNTER — Encounter (INDEPENDENT_AMBULATORY_CARE_PROVIDER_SITE_OTHER): Payer: Self-pay | Admitting: Surgery

## 2012-01-15 ENCOUNTER — Ambulatory Visit (INDEPENDENT_AMBULATORY_CARE_PROVIDER_SITE_OTHER): Payer: BC Managed Care – PPO | Admitting: Surgery

## 2012-01-15 VITALS — BP 112/79 | HR 81 | Temp 97.4°F | Ht 62.5 in | Wt 151.6 lb

## 2012-01-15 DIAGNOSIS — R1011 Right upper quadrant pain: Secondary | ICD-10-CM

## 2012-01-15 NOTE — Progress Notes (Signed)
  CC: Possible hernia above umbilicus HPI: This patient underwent mastectomy with TRAM flap reconstruction and I then seen back for over a year. She's had been some pain and discomfort and a bulging area just above her umbilicus. It doesn't seem to pop in and out like a normal hernia. She also has some bloating and distention in the right upper abdomen. She cannulated in particular to any type of food. She saw her plastic surgeon who did not think she had a hernia but asked [place] to see her.  She notes that she has had no evidence of recurrence from her breast cancer and seems to be doing well from that standpoint.   ROS: No new issues  MEDS: Current Outpatient Prescriptions on File Prior to Visit  Medication Sig Dispense Refill  . citalopram (CELEXA) 20 MG tablet Take 20 mg by mouth daily.      . Multiple Vitamin (MULTIVITAMIN) tablet Take 1 tablet by mouth daily.      . Calcium Carbonate-Vitamin D (CALCIUM-VITAMIN D) 500-200 MG-UNIT per tablet Take 1 tablet by mouth 2 (two) times daily with a meal.      . cholecalciferol (VITAMIN D) 400 UNITS TABS Take 400 Units by mouth daily.         ALLERGIES:  Allergies  Allergen Reactions  . Contrast Media (Iodinated Diagnostic Agents)     Sneezing,draining, face flushed  . Gadolinium      Code: SNEEZE, Desc: PT EXPERIENCED SNEEZING, SWELLING OF LIPS, COUGHING AND HIVES, GIVEN PO BENADRYL, Onset Date: [id number]      PE General: The patient alert oriented healthy appearing Vital signs:BP 112/79  Pulse 81  Temp(Src) 97.4 F (36.3 C) (Temporal)  Ht 5' 2.5" (1.588 m)  Wt 151 lb 9.6 oz (68.765 kg)  BMI 27.29 kg/m2  SpO2 98%  Abdomen: The abdomen is soft. There is she points out is just above and perhaps a little to the right of her umbilicus. I cannot detect a hernia in that location were hernia anyplace else in the abdomen. She is up to three tender and there are no masses. Bowel sounds are normal. All incisions are well-healed.  Data  Reviewed I have reviewed over the old records  Assessment Abdominal discomfort and bloating right upper abdomen uncertain etiology without obvious hernia or mass.  Plan I don't think any surgical intervention is appropriate yet. She continues to have symptoms it might be appropriate to get a gallbladder ultrasound and it is negative perhaps an abdominal CT to see if some fascial defect can be identified that were not feeling on physical examination.

## 2012-01-15 NOTE — Telephone Encounter (Signed)
Error

## 2012-01-15 NOTE — Patient Instructions (Signed)
If you continue with discomfort and bloating you should follow up with Dr Katrinka Blazing and see if it might be your gall bladder

## 2012-04-01 ENCOUNTER — Other Ambulatory Visit: Payer: Self-pay | Admitting: Diagnostic Radiology

## 2012-04-01 MED ORDER — DIPHENHYDRAMINE HCL 50 MG PO CAPS: ORAL_CAPSULE | ORAL | Status: DC

## 2012-04-01 MED ORDER — PREDNISONE 50 MG PO TABS: ORAL_TABLET | ORAL | Status: AC

## 2012-04-16 ENCOUNTER — Ambulatory Visit
Admission: RE | Admit: 2012-04-16 | Discharge: 2012-04-16 | Disposition: A | Discharge status: Home / Self Care | Payer: BC Managed Care – PPO | Source: Ambulatory Visit | Attending: Physician Assistant | Admitting: Physician Assistant

## 2012-04-16 DIAGNOSIS — Z1231 Encounter for screening mammogram for malignant neoplasm of breast: Secondary | ICD-10-CM

## 2012-04-22 ENCOUNTER — Other Ambulatory Visit: Payer: Self-pay | Admitting: Physician Assistant

## 2012-04-22 ENCOUNTER — Ambulatory Visit
Admission: RE | Admit: 2012-04-22 | Discharge: 2012-04-22 | Disposition: A | Discharge status: Home / Self Care | Payer: BC Managed Care – PPO | Source: Ambulatory Visit | Attending: Physician Assistant | Admitting: Physician Assistant

## 2012-04-22 DIAGNOSIS — R928 Other abnormal and inconclusive findings on diagnostic imaging of breast: Secondary | ICD-10-CM

## 2012-04-22 DIAGNOSIS — Z853 Personal history of malignant neoplasm of breast: Secondary | ICD-10-CM

## 2012-04-22 MED ORDER — GADOBENATE DIMEGLUMINE 529 MG/ML IV SOLN
14.0000 mL | Freq: Once | INTRAVENOUS | Status: AC | PRN
  Administered 2012-04-22: 14 mL via INTRAVENOUS

## 2012-04-23 ENCOUNTER — Other Ambulatory Visit: Payer: Self-pay | Admitting: Physician Assistant

## 2012-04-23 DIAGNOSIS — R928 Other abnormal and inconclusive findings on diagnostic imaging of breast: Secondary | ICD-10-CM

## 2012-04-27 ENCOUNTER — Telehealth: Payer: Self-pay | Admitting: *Deleted

## 2012-04-27 NOTE — Telephone Encounter (Signed)
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

## 2012-04-29 ENCOUNTER — Other Ambulatory Visit: Payer: BC Managed Care – PPO | Admitting: Lab

## 2012-04-30 ENCOUNTER — Ambulatory Visit
Admission: RE | Admit: 2012-04-30 | Discharge: 2012-04-30 | Disposition: A | Discharge status: Home / Self Care | Payer: BC Managed Care – PPO | Source: Ambulatory Visit | Attending: Physician Assistant | Admitting: Physician Assistant

## 2012-04-30 ENCOUNTER — Other Ambulatory Visit: Payer: Self-pay | Admitting: Physician Assistant

## 2012-04-30 DIAGNOSIS — R928 Other abnormal and inconclusive findings on diagnostic imaging of breast: Secondary | ICD-10-CM

## 2012-05-04 ENCOUNTER — Other Ambulatory Visit: Payer: Self-pay | Admitting: *Deleted

## 2012-05-04 ENCOUNTER — Other Ambulatory Visit (HOSPITAL_BASED_OUTPATIENT_CLINIC_OR_DEPARTMENT_OTHER): Payer: BC Managed Care – PPO | Admitting: Lab

## 2012-05-04 DIAGNOSIS — C50919 Malignant neoplasm of unspecified site of unspecified female breast: Secondary | ICD-10-CM

## 2012-05-04 DIAGNOSIS — Z853 Personal history of malignant neoplasm of breast: Secondary | ICD-10-CM

## 2012-05-04 LAB — CBC WITH DIFFERENTIAL/PLATELET
Basophils Absolute: 0 10*3/uL (ref 0.0–0.1)
EOS%: 0.9 % (ref 0.0–7.0)
Eosinophils Absolute: 0.1 10*3/uL (ref 0.0–0.5)
HGB: 12.1 g/dL (ref 11.6–15.9)
MCH: 32.6 pg (ref 25.1–34.0)
NEUT#: 5.4 10*3/uL (ref 1.5–6.5)
RDW: 12.9 % (ref 11.2–14.5)
lymph#: 3.1 10*3/uL (ref 0.9–3.3)

## 2012-05-04 LAB — COMPREHENSIVE METABOLIC PANEL (CC13)
AST: 16 U/L (ref 5–34)
Albumin: 3.9 g/dL (ref 3.5–5.0)
BUN: 18 mg/dL (ref 7.0–26.0)
Calcium: 9.3 mg/dL (ref 8.4–10.4)
Chloride: 102 mEq/L (ref 98–107)
Potassium: 3.9 mEq/L (ref 3.5–5.1)
Total Protein: 7.3 g/dL (ref 6.4–8.3)

## 2012-05-06 ENCOUNTER — Ambulatory Visit: Payer: BC Managed Care – PPO | Admitting: Oncology

## 2012-05-06 ENCOUNTER — Ambulatory Visit (HOSPITAL_BASED_OUTPATIENT_CLINIC_OR_DEPARTMENT_OTHER): Payer: BC Managed Care – PPO | Admitting: Oncology

## 2012-05-06 VITALS — BP 122/80 | HR 80 | Temp 98.5°F | Resp 20 | Ht 62.5 in | Wt 154.2 lb

## 2012-05-06 DIAGNOSIS — C50419 Malignant neoplasm of upper-outer quadrant of unspecified female breast: Secondary | ICD-10-CM

## 2012-05-06 DIAGNOSIS — Z171 Estrogen receptor negative status [ER-]: Secondary | ICD-10-CM

## 2012-05-06 DIAGNOSIS — Z853 Personal history of malignant neoplasm of breast: Secondary | ICD-10-CM

## 2012-05-06 NOTE — Progress Notes (Signed)
ID: [name]   DOB: 09/05/1955  MR#: [id number]  CSN#:[id number]  PCP: Allean Found, MD GYN:  SUCicero Duck OTHER MD:  HISTORY OF PRESENT ILLNESS: Debbie had routine screening mammography 02/14/2009 at [facility] showing a possible mass in the right breast.  Additional studies on 07/09 showed no areas of persistent distortion, mass, or new calcifications, and by exam Dr. Judyann Munson was not able to palpate a mass in the right breast.  Ultrasound of the right breast showed normal tissue without again any persistent distortion, mass, or shadowing.    The patient had a repeat diagnostic mammography 10/15/2009 as a "35-month" followup.  Dr. Jean Rosenthal on this view found that the breasts were heterogeneously dense.  This of course reduces mammographic sensitivity.  The area of questionable distortion noted on the screening study from 01/2009 was not seen on this study.  There was a small cluster of stable, benign appearing calcifications centrally and slightly laterally in the right breast.  The patient was then recalled for a further 38-month followup on 03/21/2010.  On the 08/04 mammogram, again dense breasts were noted, but now there was an area of distortion in the lateral/central aspect of the right breast.  On physical examination, Dr. Guinevere Ferrari did not palpate any abnormality.  Ultrasound, however, showed a hypoechoic irregular mass with spiculated margins at the 10 o'clock position 4 mm from the nipple, measuring a total of 1.1 cm, corresponding to the area seen on mammography.  The axillae were unremarkable.    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  With this information the patient was referred to Dr. Jamey Ripa, and bilateral breast MRIs were obtained 08/09.  The area of biopsy with proven carcinoma in  the right breast did not stand out significantly from background.  However, a mass-like area of distortion could be seen in the middle third of the upper central to upper outer right breast.  This was felt to measure up to 2.2 cm, and did contain the clip.  More posteriorly there was asymmetric area of confluent non-mass-like enhancement that measured another 2 cm.  Again there was no evidence of axillary or internal mammary chain adenopathy.  Because the second area in the upper outer right breast had already been evaluated by ultrasound and mammography without any abnormalities of consequence being seen, the patient was brought back on 08/17 for MRI guided biopsy of this area.  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  This in situ tumor was also estrogen and progesterone receptor negative.  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 Margins were ample. Patient underwent TRAM reconstruction in February of 2012. She decided to forego adjuvant therapy.  Of note patient is known to be BRCA1 and 2 negative, testing having been obtained at [facility].  INTERVAL HISTORY: Eunice Blase returns today for followup of her breast cancer. 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 She is concerned about how to best deal  with this.  REVIEW OF SYSTEMS: She is having some soreness where she had the multiple left breast biopsies but that is resolving. She feels her cytalopram very helpful. She is able to do more and worry less. She has some heartburn well controlled on Prilosec. Her hot flashes are manageable. Her periods  were dwindling but the most recent one came back within 30 days. Otherwise a detailed review of systems today, aside from the occasional migraine headache, which is not a new issue, is stable.  PAST MEDICAL HISTORY: Past Medical History  Diagnosis Date  . Hx breast cancer, IDC, Right, Grade I, triple - 04/07/2010  . Cancer     PAST SURGICAL HISTORY: Past Surgical History  Procedure Date  . Breast surgery 09/2009    05/2010  . Back surgery 09/2007    FAMILY HISTORY Family History  Problem Relation Age of Onset  . Cancer Mother     breast/skin  . Cancer Father     basal cell  . Heart disease Father     GYNECOLOGIC HISTORY:  She is GX, P2, first pregnancy to term age 55.  She is aware of course that this increases the risk of breast cancer developing.  The patient is still menstruating, and was taking hormones until 02/2009.  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  Her husband, Nadine Counts, participates in a [employer]  Daughter, Diannia Ruder, 21, has a degree in psychology, and she participates in the [employer].  Shari Heritage, Christiane Ha, 19, is attending bible college as a sophomore.  The patient is a member of the [employer].   ADVANCED DIRECTIVES:  HEALTH MAINTENANCE: History  Substance Use Topics  . Smoking status: Never Smoker   . Smokeless tobacco: Not on file  . Alcohol Use: 0.0 oz/week    2-4 Glasses of wine per week     Colonoscopy:  PAP:  Bone density:  Lipid panel:  Allergies  Allergen Reactions  . Contrast Media (Iodinated Diagnostic Agents)     Sneezing,draining, face flushed  . Gadolinium      Code: SNEEZE, Desc: PT EXPERIENCED SNEEZING, SWELLING OF LIPS, COUGHING AND HIVES, GIVEN PO BENADRYL, Onset Date: [id number]     Current Outpatient Prescriptions  Medication Sig Dispense Refill  . Calcium Carbonate-Vitamin D (CALCIUM-VITAMIN D) 500-200 MG-UNIT per tablet Take 1 tablet by mouth 2 (two) times  daily with a meal.      . cholecalciferol (VITAMIN D) 400 UNITS TABS Take 400 Units by mouth daily.      . citalopram (CELEXA) 20 MG tablet Take 20 mg by mouth daily.      . diphenhydrAMINE (BENADRYL) 50 MG capsule Take medicine 1 hour prior to procedure  1 capsule  0  . fish oil-omega-3 fatty acids 1000 MG capsule Take 2 g by mouth daily.      . Melatonin 5 MG CAPS Take 5 mg by mouth at bedtime.      . Multiple Vitamin (MULTIVITAMIN) tablet Take 1 tablet by mouth daily.      Marland Kitchen VALERIAN ROOT PO Take by mouth at bedtime.      . vitamin E 400 UNIT capsule Take 400 Units by mouth daily.        OBJECTIVE: Middle-aged white woman who appears well Filed Vitals:   05/06/12 1609  BP: 122/80  Pulse: 80  Temp: 98.5 F (36.9 C)  Resp: 20     Body mass index is  27.75 kg/(m^2).    ECOG FS: 0   Physical Exam: HEENT:  Sclerae anicteric. Oropharynx clear.  Nodes:  No cervical, supraclavicular, or axillary lymphadenopathy palpated.  Breast Exam:  Right breast is status post mastectomy with TRAM reconstruction. No evidence of local recurrence. The left breast is status post multiple biopsies, pretty much bruises all over, with some edema. Lungs:  Clear to auscultation bilaterally.   Heart:  Regular rate and rhythm.   Abdomen:  Soft, nontender.  Positive bowel sounds. Musculoskeletal:  No focal spinal tenderness  Extremities: No peripheral edema  Neuro:  Nonfocal.    LAB RESULTS: Lab Results  Component Value Date   WBC 9.3 05/04/2012   NEUTROABS 5.4 05/04/2012   HGB 12.1 05/04/2012   HCT 36.5 05/04/2012   MCV 97.9 05/04/2012   PLT 265 05/04/2012      Chemistry      Component Value Date/Time   NA 135* 05/04/2012 1601   NA 137 10/29/2011 1609   K 3.9 05/04/2012 1601   K 3.7 10/29/2011 1609   CL 102 05/04/2012 1601   CL 101 10/29/2011 1609   CO2 23 05/04/2012 1601   CO2 27 10/29/2011 1609   BUN 18.0 05/04/2012 1601   BUN 18 10/29/2011 1609   CREATININE 0.7 05/04/2012 1601   CREATININE 0.69  10/29/2011 1609      Component Value Date/Time   CALCIUM 9.3 05/04/2012 1601   CALCIUM 9.2 10/29/2011 1609   ALKPHOS 49 05/04/2012 1601   ALKPHOS 45 10/29/2011 1609   AST 16 05/04/2012 1601   AST 19 10/29/2011 1609   ALT 11 05/04/2012 1601   ALT 8 10/29/2011 1609   BILITOT 0.90 05/04/2012 1601   BILITOT 0.5 10/29/2011 1609       Lab Results  Component Value Date   LABCA2 15 05/04/2012    STUDIES: US Breast Left  04/30/2012  *RADIOLOGY REPORT*  Clinical Data:  Possible mass left breast identified on recent screening mammogram, and diffuse confluent areas of suspicious enhancement in the central left breast on recent screening breast MRI.  The patient has a history of right breast cancer, diagnosed in 2011, and has undergone right mastectomy. The patient reports that she has had some pain in her central left breast recently.  DIGITAL DIAGNOSTIC LEFT MAMMOGRAM WITHOUT CAD AND LEFT BREAST ULTRASOUND:  Comparison:  With prior mammograms, and with recent breast MRI of 04/22/2012  Findings:  Focal spot compression views of the central left breast, posterior to the superior to the left nipple demonstrate a suspicious area of distortion that spans approximately 6 x 6 x 6 cm.  There is the left nipple retraction.  On physical exam, I do not palpate a discrete mass.  There is slight thickening of the retroareolar central left breast compared to the more peripheral parts of the breast.  Ultrasound is performed, showing very irregular hypoechogenicity, with associated posterior acoustic shadowing.  This abnormality is seen in the periareolar left breast centered in the 12 o'clock region, and in the 6 o'clock region of the left breast, most prominent 4-5 cm from the nipple. Similar hypoechogenicity involves the 3 o'clock region of the left breast, periareolar.  This diffuse hypoechogenicity is not discretely measurable, and is felt to correspond to the area of suspicious enhancement on MRI, and architectural  distortion on mammogram.  Normal left axillary lymph nodes are imaged.  IMPRESSION: Findings suspicious for multicentric malignancy in the left breast.  RECOMMENDATION: Ultrasound guided core needle biopsy of the 12  o'clock region of the left breast and the 6 o'clock region of the left breast.  BI-RADS CATEGORY 5:  Highly suggestive of malignancy - appropriate action should be taken.   Original Report Authenticated By: Britta Mccreedy, M.D.    Mr Breast Bilateral W Wo Contrast  04/23/2012  *RADIOLOGY REPORT*  Clinical Data: History of right mastectomy 2011 with TRAM flap reconstruction for breast cancer.  Maternal history of breast cancer, deceased at age 71. The patient recently underwent screening mammography 04/16/2012 and was recalled for further imaging for possible abnormality in the left breast.  She has not yet returned for diagnostic evaluation pending performance and interpretation of MRI.  The patient was administered the standard 13 hours premedication protocol for prior contrast reaction.  She experienced no ill effects after administration of contrast for the current examination.  BILATERAL BREAST MRI WITH AND WITHOUT CONTRAST  Technique: Multiplanar, multisequence MR images of both breasts were obtained prior to and following the intravenous administration of 14ml of Multihance.  Three dimensional images were evaluated at the independent DynaCad workstation.  Comparison:  MRI 04/03/2010, mammogram 04/16/2012  Findings: Normal-appearing right-sided TRAM flap is noted.  No lymphadenopathy or abnormal T2-weighted hyperintensity on either side.  There is diffuse confluent clumped nodular and irregular enhancement throughout the central left breast with areas of washin/washout enhancement type kinetics, measuring overall 8.3 x 7.8 x 6.6 cm.  There is a central mass like component which is felt to correspond to the recent questioned abnormality on screening mammography.  No abnormal enhancement is seen in  the right TRAM flap.  IMPRESSION: Diffuse confluent clumped nodular and irregular left central breast enhancement, which in the central portion corresponds to the questioned abnormality on recent screening mammographic examination.  The patient will be scheduled for left diagnostic mammography, left breast ultrasound, and possible left ultrasound- guided core biopsy if a sonographic correlate can be visualized. Ideally, identification of two areas for possible ultrasound-guided core biopsy to determine possible multifocal/multicentric disease would be helpful if possible.  Otherwise, depending on results of diagnostic examination, MRI guided core biopsy could be necessary to determine extent of disease.  RECOMMENDATION: Left diagnostic mammogram, ultrasound, and possibly ultrasound guided core biopsy (possibly two locations if able).  I left a message on the patient's voice mail to discuss these findings and recommendations on 04/23/2012 at 2 o'clock p.m.  We will continue to attempt to reach the patient to schedule further imaging.  THREE-DIMENSIONAL MR IMAGE RENDERING ON INDEPENDENT WORKSTATION:  Three-dimensional MR images were rendered by post-processing of the original MR data on an independent workstation.  The three- dimensional MR images were interpreted, and findings were reported in the accompanying complete MRI report for this study.   Original Report Authenticated By: Harrel Lemon, M.D.    [place] Core Biopsy  04/30/2012  *RADIOLOGY REPORT*  Clinical Data:  Suspicious abnormality in the central left breast, seen on mammogram, ultrasound, and MRI.  Ultrasound-guided biopsies of two separate areas in the left breast recommended.  This dictation includes both biopsies, one in the 6 o'clock region of the left breast, and one in the 12 o'clock region of the left breast.  ULTRASOUND GUIDED VACUUM ASSISTED CORE BIOPSIES OF THE LEFT BREAST  Comparison: Previous exams.  I met with the patient and we discussed  the procedure of ultrasound- guided biopsy, including benefits and alternatives.  We discussed the high likelihood of a successful procedure. We discussed the risks of the procedure including infection, bleeding, tissue injury, clip migration,  and inadequate sampling.  Informed written consent was given.  Using sterile technique, 2% lidocaine ultrasound guidance and a 12 gauge vacuum assisted needle biopsy was performed of hypoechogenicity in the 6 o'clock region of the left breast using a medial to lateral approach.  At the conclusion of the procedure, a ribbon-shaped tissue marker clip was deployed into the biopsy cavity.  Follow-up 2-view mammogram was performed and dictated separately.  Using sterile technique, 2% lidocaine ultrasound guidance and a 12 gauge vacuum assisted needle biopsy was performed of hypoechogenicity in the 12 o'clock region of the left breast using a medial to lateral approach.  At the conclusion of the procedure, a coil tissue marker clip was deployed into the biopsy cavity.  Follow-up 2-view mammogram was performed and dictated separately.  IMPRESSION: Ultrasound-guided biopsy of two areas of concern in the central left breast, at 6 o'clock position, and 12 o'clock position.  No apparent complications.   Original Report Authenticated By: Britta Mccreedy, M.D.    [place] Core Biopsy  04/30/2012  *RADIOLOGY REPORT*  Clinical Data:  Suspicious abnormality in the central left breast, seen on mammogram, ultrasound, and MRI.  Ultrasound-guided biopsies of two separate areas in the left breast recommended.  This dictation includes both biopsies, one in the 6 o'clock region of the left breast, and one in the 12 o'clock region of the left breast.  ULTRASOUND GUIDED VACUUM ASSISTED CORE BIOPSIES OF THE LEFT BREAST  Comparison: Previous exams.  I met with the patient and we discussed the procedure of ultrasound- guided biopsy, including benefits and alternatives.  We discussed the high likelihood of a  successful procedure. We discussed the risks of the procedure including infection, bleeding, tissue injury, clip migration, and inadequate sampling.  Informed written consent was given.  Using sterile technique, 2% lidocaine ultrasound guidance and a 12 gauge vacuum assisted needle biopsy was performed of hypoechogenicity in the 6 o'clock region of the left breast using a medial to lateral approach.  At the conclusion of the procedure, a ribbon-shaped tissue marker clip was deployed into the biopsy cavity.  Follow-up 2-view mammogram was performed and dictated separately.  Using sterile technique, 2% lidocaine ultrasound guidance and a 12 gauge vacuum assisted needle biopsy was performed of hypoechogenicity in the 12 o'clock region of the left breast using a medial to lateral approach.  At the conclusion of the procedure, a coil tissue marker clip was deployed into the biopsy cavity.  Follow-up 2-view mammogram was performed and dictated separately.  IMPRESSION: Ultrasound-guided biopsy of two areas of concern in the central left breast, at 6 o'clock position, and 12 o'clock position.  No apparent complications.   Original Report Authenticated By: Britta Mccreedy, M.D.    Mm Digital Diag Ltd L  04/30/2012  *RADIOLOGY REPORT*  Clinical Data:  Possible mass left breast identified on recent screening mammogram, and diffuse confluent areas of suspicious enhancement in the central left breast on recent screening breast MRI.  The patient has a history of right breast cancer, diagnosed in 2011, and has undergone right mastectomy. The patient reports that she has had some pain in her central left breast recently.  DIGITAL DIAGNOSTIC LEFT MAMMOGRAM WITHOUT CAD AND LEFT BREAST ULTRASOUND:  Comparison:  With prior mammograms, and with recent breast MRI of 04/22/2012  Findings:  Focal spot compression views of the central left breast, posterior to the superior to the left nipple demonstrate a suspicious area of distortion that  spans approximately 6 x 6 x 6 cm.  There  is the left nipple retraction.  On physical exam, I do not palpate a discrete mass.  There is slight thickening of the retroareolar central left breast compared to the more peripheral parts of the breast.  Ultrasound is performed, showing very irregular hypoechogenicity, with associated posterior acoustic shadowing.  This abnormality is seen in the periareolar left breast centered in the 12 o'clock region, and in the 6 o'clock region of the left breast, most prominent 4-5 cm from the nipple. Similar hypoechogenicity involves the 3 o'clock region of the left breast, periareolar.  This diffuse hypoechogenicity is not discretely measurable, and is felt to correspond to the area of suspicious enhancement on MRI, and architectural distortion on mammogram.  Normal left axillary lymph nodes are imaged.  IMPRESSION: Findings suspicious for multicentric malignancy in the left breast.  RECOMMENDATION: Ultrasound guided core needle biopsy of the 12 o'clock region of the left breast and the 6 o'clock region of the left breast.  BI-RADS CATEGORY 5:  Highly suggestive of malignancy - appropriate action should be taken.   Original Report Authenticated By: Britta Mccreedy, M.D.    Mm Digital Diagnostic Unilat L  04/30/2012  *RADIOLOGY REPORT*  Clinical Data:  Two separate biopsies were performed in the central left breast of an area of concern seen on ultrasound and MRI.  DIGITAL DIAGNOSTIC LEFT MAMMOGRAM  Comparison:  Previous exams.  Findings:  Films are performed following two ultrasound guided biopsies of abnormal  hypoechogenicity on ultrasound, seen in the 12 o'clock and 6 o'clock regions.  A ribbon shaped biopsy clip is present in the 6 o'clock position of the left breast, and appears in satisfactory position.  A coil biopsy clip is present in the 12 o'clock left breast, within the anterior aspect of the distortion is seen on mammogram, in satisfactory position.  IMPRESSION:  Satisfactory position of two biopsy clips in the left breast as described above.  The pathology associated with the left breast ultrasound guided core biopsy at the 6 o'clock position demonstrated benign fibrocystic changes.  The pathology associated with the left breast ultrasound guided core biopsy at the 12 o'clock position demonstrated atypical ductal hyperplasia with calcifications. Surgical excision is recommended.  On review of the imaging findings, the area of distortion at the 12 o'clock position is worrisome and surgical excision is recommended.  I have contacted Dr. Tenna Child nurse who will arrange a surgical appointment with the patient.  The patient has undergone a previous right breast malignant mastectomy by Dr. Jamey Ripa in 2011. The pathologic findings were discussed with the patient and her questions were answered. The patient states that she is moderately tender with ecchymosis but without hematoma formation or signs of infection.  The patient was encouraged called [facility] for additional questions or concerns.  Addended by:  Rolla Plate, M.D. on 05/04/2012 15:28:06.  **END ADDENDUM** SIGNED BY: Rolla Plate, M.D.  Original Report Authenticated By: Rolla Plate, M.D.    Mm Digital Screening  04/21/2012  *RADIOLOGY REPORT*  Clinical Data: Screening.  DIGITAL UNILATERAL SCREENING MAMMOGRAM WITH CAD  Comparison:  Previous exams.  Findings: Two views of the left breast demonstrate heterogeneously dense tissue.  In the left breast, a possible mass warrants further evaluation with spot compression views and possibly ultrasound. MLO view of the right TRAM flap is negative.  Images were processed with CAD.  IMPRESSION: Further evaluation is suggested for possible mass in the left breast.  RECOMMENDATION: Diagnostic mammogram and possibly ultrasound of the left breast. (Code:FI-L-36M)  BI-RADS  CATEGORY 0:  Incomplete.  Need additional imaging evaluation and/or prior mammograms for  comparison.   Original Report Authenticated By: Littie Deeds. ARCEO, M.D.     ASSESSMENT: A 56 year old BRCA negative Kathryne Sharper woman   (1)  status post right breast biopsy August of 2011, with definitive right mastectomy and sentinel lymph node sampling October of 2011 and TRAM reconstruction February 2102 for what proved to be a pT2 pN0, stage IIA invasive ductal carcinoma, grade 1, triple negative.  There was also grade III DCIS, again estrogen and progesterone receptor negative.  Margins were ample.   (2) the patient  decided to forego adjuvant therapy.    (3)  status post left breast biopsy ninth 13 2013 showing ([id number]) atypical ductal hyperplasia with apocrine change.      PLAN: She is considering mastectomy versus lumpectomy. She understands she does need to have this area removed from the left breast, but that even if it proves to be ductal carcinoma in situ, it appears noninvasive. She understands a left mastectomy in that case would be curative. If she had a lumpectomy and there was ductal carcinoma in situ she would need postoperative radiation. She is aware of all this and at present can't quite decide, chiefly because if she did have mastectomy she would want reconstruction and she is not sure she wants to go through all that surgery.  She already has an appointment with Dr. Jamey Ripa for October 1, and that decision can be discussed further at that time. Tentatively I am making her an appointment for 2 months from now, but she can cancel that and return to see [place] 6 months from now for routine followup, depending on her surgical results. She knows to call for any other issues that may develop before the next visit.Marland Kitchen  MAGRINAT,GUSTAV C    05/06/2012

## 2012-05-10 ENCOUNTER — Telehealth: Payer: Self-pay | Admitting: Oncology

## 2012-05-10 NOTE — Telephone Encounter (Signed)
LVM for pt regarding nov and march appt.....mailed nov and march to pt.....Marland Kitchensed

## 2012-05-18 ENCOUNTER — Encounter (INDEPENDENT_AMBULATORY_CARE_PROVIDER_SITE_OTHER): Payer: Self-pay | Admitting: Surgery

## 2012-05-18 ENCOUNTER — Ambulatory Visit (INDEPENDENT_AMBULATORY_CARE_PROVIDER_SITE_OTHER): Payer: BC Managed Care – PPO | Admitting: Surgery

## 2012-05-18 VITALS — BP 136/76 | HR 77 | Temp 98.2°F | Resp 18 | Ht 62.0 in | Wt 152.0 lb

## 2012-05-18 DIAGNOSIS — N6099 Unspecified benign mammary dysplasia of unspecified breast: Secondary | ICD-10-CM

## 2012-05-18 DIAGNOSIS — C50912 Malignant neoplasm of unspecified site of left female breast: Secondary | ICD-10-CM | POA: Insufficient documentation

## 2012-05-18 DIAGNOSIS — N6089 Other benign mammary dysplasias of unspecified breast: Secondary | ICD-10-CM

## 2012-05-18 NOTE — Patient Instructions (Signed)
We will schedule outpatient surgery to remove the area of atypical ductal hyperplasia in your left breast.

## 2012-05-18 NOTE — Progress Notes (Signed)
Patient ID: Ariel Mccall, female   DOB: March 25, 1956, 57 y.o.   MRN: [id number]  Chief Complaint  Patient presents with  . Breast Mass    ADH on NCB    HPI Ariel Mccall is a 56 y.o. female.  She has a history of a right breast cancer treated with a mastectomy. She had a TRAM flap reconstruction. She usually had a mammogram in 2 areas abnormality were found in the left breast, one at the 12:00 and one at the 6:00 position. Both of them biopsied. The area at 6:00 is benign fibrocystic change. The area at 12:00 shows ADH and surgical excision was recommended. She is having no breast symptoms. HPI  Past Medical History  Diagnosis Date  . Hx breast cancer, IDC, Right, Grade I, triple - 04/07/2010  . Cancer     Past Surgical History  Procedure Date  . Breast surgery 09/2009    05/2010  . Back surgery 09/2007    Family History  Problem Relation Age of Onset  . Cancer Mother     breast/skin  . Cancer Father     basal cell  . Heart disease Father     Social History History  Substance Use Topics  . Smoking status: Never Smoker   . Smokeless tobacco: Not on file  . Alcohol Use: 0.0 oz/week    2-4 Glasses of wine per week    Allergies  Allergen Reactions  . Contrast Media (Iodinated Diagnostic Agents)     Sneezing,draining, face flushed  . Gadolinium      Code: SNEEZE, Desc: PT EXPERIENCED SNEEZING, SWELLING OF LIPS, COUGHING AND HIVES, GIVEN PO BENADRYL, Onset Date: [id number]     Current Outpatient Prescriptions  Medication Sig Dispense Refill  . Calcium Carbonate-Vitamin D (CALCIUM-VITAMIN D) 500-200 MG-UNIT per tablet Take 1 tablet by mouth 2 (two) times daily with a meal.      . cholecalciferol (VITAMIN D) 400 UNITS TABS Take 400 Units by mouth daily.      . citalopram (CELEXA) 20 MG tablet Take 20 mg by mouth daily.      . diphenhydrAMINE (BENADRYL) 50 MG capsule Take medicine 1 hour prior to procedure  1 capsule  0  . fish oil-omega-3 fatty acids 1000 MG capsule  Take 2 g by mouth daily.      . Melatonin 5 MG CAPS Take 5 mg by mouth at bedtime.      . Multiple Vitamin (MULTIVITAMIN) tablet Take 1 tablet by mouth daily.      Marland Kitchen VALERIAN ROOT PO Take by mouth at bedtime.      . vitamin E 400 UNIT capsule Take 400 Units by mouth daily.        Review of Systems Review of Systems  Constitutional: Negative for fever, chills and unexpected weight change.  HENT: Negative for hearing loss, congestion, sore throat, trouble swallowing and voice change.   Eyes: Negative for visual disturbance.  Respiratory: Positive for cough. Negative for wheezing.        From uri  Cardiovascular: Negative for chest pain, palpitations and leg swelling.  Gastrointestinal: Negative for nausea, vomiting, abdominal pain, diarrhea, constipation, blood in stool, abdominal distention and anal bleeding.  Genitourinary: Negative for hematuria, vaginal bleeding and difficulty urinating.  Musculoskeletal: Negative for arthralgias.  Skin: Negative for rash and wound.  Neurological: Negative for seizures, syncope and headaches.  Hematological: Negative for adenopathy. Does not bruise/bleed easily.  Psychiatric/Behavioral: Negative for confusion.    Blood pressure 136/76,  pulse 77, temperature 98.2 F (36.8 C), temperature source Temporal, resp. rate 18, height 5\' 2"  (1.575 m), weight 152 lb (68.947 kg), SpO2 99.00%.  Physical Exam Physical Exam  Vitals reviewed. Constitutional: She is oriented to person, place, and time. She appears well-developed and well-nourished. No distress.  HENT:  Head: Normocephalic and atraumatic.  Mouth/Throat: Oropharynx is clear and moist.  Eyes: Conjunctivae normal and EOM are normal. Pupils are equal, round, and reactive to light. No scleral icterus.  Neck: Normal range of motion. Neck supple. No tracheal deviation present. No thyromegaly present.  Cardiovascular: Normal rate, regular rhythm, normal heart sounds and intact distal pulses.  Exam  reveals no gallop and no friction rub.   No murmur heard. Pulmonary/Chest: Effort normal and breath sounds normal. No respiratory distress. She has no wheezes. She has no rales.         TRAM on right. Eccymosis on left and I think a hematoma at that location  Abdominal: Soft. Bowel sounds are normal. She exhibits no distension and no mass. There is no tenderness. There is no rebound and no guarding.  Musculoskeletal: Normal range of motion. She exhibits no edema and no tenderness.  Lymphadenopathy:    She has no cervical adenopathy.    She has no axillary adenopathy.       Right: No supraclavicular adenopathy present.  Neurological: She is alert and oriented to person, place, and time.  Skin: Skin is warm and dry. No rash noted. She is not diaphoretic. No erythema.  Psychiatric: She has a normal mood and affect. Her behavior is normal. Judgment and thought content normal.    Data Reviewed I have reviewed the old medical record, the mammogram reports, and the pathology report.  Assessment    1. 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 History of right breast cancer, status post mastectomy and TRAM flap reconstruction, no evidence of local recurrence    Plan    I reviewed the situation with the patient. I recommended while localized excision of the area of ADH. We have talked about the fact this could represent DCIS or perhaps even invasive cancer. I told her once we have a final pathology report for details plans to be made depending on her final pathology. I have explained the pathophysiology and staging of breast cancer with particular attention to her exact situation. We discussed the multidisciplinary approach to breast cancer which often includes both medical and radiation oncology consultations.  We also discussed surgical options for the treatment of breast cancer including lumpectomy and mastectomy with possible reconstructive surgery. In addition we talked about the evaluation and  management of lymph nodes including a description of sentinel lymph node biopsy and axillary dissections. We reviewed potential complications and risks including bleeding, infection, numbness,  lymphedema, and the potential need for additional surgery.  She understands that for patients who are candidate for lumpectomy or mastectomy there is an equal survival rate with either technique, but a slightly higher local recurrence rate with lumpectomy. In addition she knows that a lumpectomy usually requires postoperative radiation as part of the management of the breast cancer.  We have discussed the likely postoperative course and plans for followup.  I have given the patient some written information that reviewed all of these issues. I believe her questions are answered and that she has a good understanding of the issues.        Yena Tisby J 05/18/2012, 4:48 PM

## 2012-05-20 ENCOUNTER — Telehealth (INDEPENDENT_AMBULATORY_CARE_PROVIDER_SITE_OTHER): Payer: Self-pay | Admitting: General Surgery

## 2012-05-20 NOTE — Telephone Encounter (Signed)
Message copied by Liliana Cline on Thu May 20, 2012 12:57 PM ------      Message from: Mitzi Davenport      Created: Wed May 19, 2012  2:44 PM      Regarding: Surgery case       Patient chose at date of 11.7.13 for surgery because of her work schedule.  Patient now wants to know if this is too far out for her to wait for surgery or should she try and have surgery sooner, please advise.            Thank you      Loni Muse

## 2012-05-20 NOTE — Telephone Encounter (Signed)
Left message on machine for patient to call back and ask for me.   

## 2012-05-20 NOTE — Telephone Encounter (Signed)
Spoke to patient. Made her aware it is okay to wait for surgery. Post op appt made.

## 2012-06-18 ENCOUNTER — Encounter (HOSPITAL_BASED_OUTPATIENT_CLINIC_OR_DEPARTMENT_OTHER): Payer: Self-pay | Admitting: *Deleted

## 2012-06-18 DIAGNOSIS — N6099 Unspecified benign mammary dysplasia of unspecified breast: Secondary | ICD-10-CM

## 2012-06-18 HISTORY — DX: Unspecified benign mammary dysplasia of unspecified breast: N60.99

## 2012-06-23 ENCOUNTER — Other Ambulatory Visit (INDEPENDENT_AMBULATORY_CARE_PROVIDER_SITE_OTHER): Payer: Self-pay | Admitting: Surgery

## 2012-06-23 ENCOUNTER — Encounter (HOSPITAL_BASED_OUTPATIENT_CLINIC_OR_DEPARTMENT_OTHER): Payer: Self-pay | Admitting: *Deleted

## 2012-06-23 DIAGNOSIS — N6099 Unspecified benign mammary dysplasia of unspecified breast: Secondary | ICD-10-CM

## 2012-06-24 ENCOUNTER — Ambulatory Visit (HOSPITAL_BASED_OUTPATIENT_CLINIC_OR_DEPARTMENT_OTHER)
Admission: RE | Admit: 2012-06-24 | Discharge: 2012-06-24 | Disposition: A | Discharge status: Home / Self Care | Payer: BC Managed Care – PPO | Source: Ambulatory Visit | Attending: Surgery | Admitting: Surgery

## 2012-06-24 ENCOUNTER — Encounter (HOSPITAL_BASED_OUTPATIENT_CLINIC_OR_DEPARTMENT_OTHER): Payer: Self-pay | Admitting: *Deleted

## 2012-06-24 ENCOUNTER — Encounter (HOSPITAL_BASED_OUTPATIENT_CLINIC_OR_DEPARTMENT_OTHER): Payer: Self-pay | Admitting: Anesthesiology

## 2012-06-24 ENCOUNTER — Encounter (HOSPITAL_BASED_OUTPATIENT_CLINIC_OR_DEPARTMENT_OTHER)
Admission: RE | Disposition: A | Discharge status: Home / Self Care | Payer: Self-pay | Source: Ambulatory Visit | Attending: Surgery

## 2012-06-24 ENCOUNTER — Ambulatory Visit
Admission: RE | Admit: 2012-06-24 | Discharge: 2012-06-24 | Disposition: A | Discharge status: Home / Self Care | Payer: BC Managed Care – PPO | Source: Ambulatory Visit | Attending: Surgery | Admitting: Surgery

## 2012-06-24 ENCOUNTER — Ambulatory Visit (HOSPITAL_BASED_OUTPATIENT_CLINIC_OR_DEPARTMENT_OTHER): Payer: BC Managed Care – PPO | Admitting: Anesthesiology

## 2012-06-24 DIAGNOSIS — R51 Headache: Secondary | ICD-10-CM | POA: Insufficient documentation

## 2012-06-24 DIAGNOSIS — D0590 Unspecified type of carcinoma in situ of unspecified breast: Secondary | ICD-10-CM

## 2012-06-24 DIAGNOSIS — F3289 Other specified depressive episodes: Secondary | ICD-10-CM | POA: Insufficient documentation

## 2012-06-24 DIAGNOSIS — N6089 Other benign mammary dysplasias of unspecified breast: Secondary | ICD-10-CM | POA: Insufficient documentation

## 2012-06-24 DIAGNOSIS — K219 Gastro-esophageal reflux disease without esophagitis: Secondary | ICD-10-CM | POA: Insufficient documentation

## 2012-06-24 DIAGNOSIS — N6099 Unspecified benign mammary dysplasia of unspecified breast: Secondary | ICD-10-CM

## 2012-06-24 DIAGNOSIS — F329 Major depressive disorder, single episode, unspecified: Secondary | ICD-10-CM | POA: Insufficient documentation

## 2012-06-24 HISTORY — DX: Other complications of anesthesia, initial encounter: T88.59XA

## 2012-06-24 HISTORY — DX: Gastro-esophageal reflux disease without esophagitis: K21.9

## 2012-06-24 HISTORY — DX: Major depressive disorder, single episode, unspecified: F32.9

## 2012-06-24 HISTORY — DX: Personal history of other (healed) physical injury and trauma: Z87.828

## 2012-06-24 HISTORY — DX: Unspecified benign mammary dysplasia of unspecified breast: N60.99

## 2012-06-24 HISTORY — DX: Depression, unspecified: F32.A

## 2012-06-24 HISTORY — PX: BREAST LUMPECTOMY WITH NEEDLE LOCALIZATION: SHX5759

## 2012-06-24 HISTORY — DX: Adverse effect of unspecified anesthetic, initial encounter: T41.45XA

## 2012-06-24 SURGERY — BREAST LUMPECTOMY WITH NEEDLE LOCALIZATION
Anesthesia: General | Site: Breast | Laterality: Left | Wound class: Clean

## 2012-06-24 MED ORDER — ONDANSETRON HCL 4 MG/2ML IJ SOLN
INTRAMUSCULAR | Status: DC | PRN
  Administered 2012-06-24: 4 mg via INTRAVENOUS

## 2012-06-24 MED ORDER — OXYCODONE-ACETAMINOPHEN 5-325 MG PO TABS: 1.0000 | ORAL_TABLET | ORAL | Status: DC | PRN

## 2012-06-24 MED ORDER — PROPOFOL 10 MG/ML IV BOLUS
INTRAVENOUS | Status: DC | PRN
  Administered 2012-06-24: 160 mg via INTRAVENOUS

## 2012-06-24 MED ORDER — EPHEDRINE SULFATE 50 MG/ML IJ SOLN
INTRAMUSCULAR | Status: DC | PRN
Start: 2012-06-24 — End: 2012-06-24
  Administered 2012-06-24: 10 mg via INTRAVENOUS

## 2012-06-24 MED ORDER — BUPIVACAINE HCL (PF) 0.25 % IJ SOLN
INTRAMUSCULAR | Status: DC | PRN
  Administered 2012-06-24: 30 mL

## 2012-06-24 MED ORDER — GLYCOPYRROLATE 0.2 MG/ML IJ SOLN
INTRAMUSCULAR | Status: DC | PRN
  Administered 2012-06-24: 0.2 mg via INTRAVENOUS

## 2012-06-24 MED ORDER — LIDOCAINE HCL (CARDIAC) 20 MG/ML IV SOLN
INTRAVENOUS | Status: DC | PRN
  Administered 2012-06-24: 80 mg via INTRAVENOUS

## 2012-06-24 MED ORDER — FENTANYL CITRATE 0.05 MG/ML IJ SOLN: 50.0000 ug | INTRAMUSCULAR | Status: DC | PRN

## 2012-06-24 MED ORDER — DEXAMETHASONE SODIUM PHOSPHATE 4 MG/ML IJ SOLN
INTRAMUSCULAR | Status: DC | PRN
  Administered 2012-06-24: 10 mg via INTRAVENOUS

## 2012-06-24 MED ORDER — LACTATED RINGERS IV SOLN
INTRAVENOUS | Status: DC
  Administered 2012-06-24: 09:00:00 via INTRAVENOUS

## 2012-06-24 MED ORDER — MIDAZOLAM HCL 5 MG/5ML IJ SOLN
INTRAMUSCULAR | Status: DC | PRN
  Administered 2012-06-24: 2 mg via INTRAVENOUS

## 2012-06-24 MED ORDER — FENTANYL CITRATE 0.05 MG/ML IJ SOLN
INTRAMUSCULAR | Status: DC | PRN
  Administered 2012-06-24 (×2): 50 ug via INTRAVENOUS

## 2012-06-24 MED ORDER — CEFAZOLIN SODIUM-DEXTROSE 2-3 GM-% IV SOLR
INTRAVENOUS | Status: DC | PRN
  Administered 2012-06-24: 2 g via INTRAVENOUS

## 2012-06-24 MED ORDER — MIDAZOLAM HCL 2 MG/2ML IJ SOLN: 1.0000 mg | INTRAMUSCULAR | Status: DC | PRN

## 2012-06-24 SURGICAL SUPPLY — 50 items
ADH SKN CLS APL DERMABOND .7 (GAUZE/BANDAGES/DRESSINGS) ×1
APPLICATOR COTTON TIP 6IN STRL (MISCELLANEOUS) IMPLANT
BINDER BREAST LRG (GAUZE/BANDAGES/DRESSINGS) IMPLANT
BINDER BREAST MEDIUM (GAUZE/BANDAGES/DRESSINGS) IMPLANT
BINDER BREAST XLRG (GAUZE/BANDAGES/DRESSINGS) IMPLANT
BINDER BREAST XXLRG (GAUZE/BANDAGES/DRESSINGS) IMPLANT
BLADE HEX COATED 2.75 (ELECTRODE) ×2 IMPLANT
BLADE SURG 15 STRL LF DISP TIS (BLADE) ×1 IMPLANT
BLADE SURG 15 STRL SS (BLADE) ×2
CANISTER SUCTION 1200CC (MISCELLANEOUS) ×2 IMPLANT
CHLORAPREP W/TINT 26ML (MISCELLANEOUS) ×2 IMPLANT
CLIP TI MEDIUM 6 (CLIP) IMPLANT
CLIP TI WIDE RED SMALL 6 (CLIP) IMPLANT
CLOTH BEACON ORANGE TIMEOUT ST (SAFETY) ×2 IMPLANT
COVER MAYO STAND STRL (DRAPES) ×2 IMPLANT
COVER TABLE BACK 60X90 (DRAPES) ×2 IMPLANT
DECANTER SPIKE VIAL GLASS SM (MISCELLANEOUS) IMPLANT
DERMABOND ADVANCED (GAUZE/BANDAGES/DRESSINGS) ×1
DERMABOND ADVANCED .7 DNX12 (GAUZE/BANDAGES/DRESSINGS) ×1 IMPLANT
DEVICE DUBIN W/COMP PLATE 8390 (MISCELLANEOUS) ×1 IMPLANT
DRAPE LAPAROTOMY TRNSV 102X78 (DRAPE) ×2 IMPLANT
DRAPE SURG 17X23 STRL (DRAPES) IMPLANT
DRAPE UTILITY XL STRL (DRAPES) ×2 IMPLANT
ELECT REM PT RETURN 9FT ADLT (ELECTROSURGICAL) ×2
ELECTRODE REM PT RTRN 9FT ADLT (ELECTROSURGICAL) ×1 IMPLANT
GLOVE BIO SURGEON STRL SZ7 (GLOVE) ×1 IMPLANT
GLOVE BIOGEL PI IND STRL 7.0 (GLOVE) IMPLANT
GLOVE BIOGEL PI INDICATOR 7.0 (GLOVE) ×1
GLOVE EUDERMIC 7 POWDERFREE (GLOVE) ×2 IMPLANT
GOWN PREVENTION PLUS XLARGE (GOWN DISPOSABLE) ×4 IMPLANT
KIT MARKER MARGIN INK (KITS) IMPLANT
NDL HYPO 25X1 1.5 SAFETY (NEEDLE) ×1 IMPLANT
NEEDLE HYPO 25X1 1.5 SAFETY (NEEDLE) ×2 IMPLANT
NS IRRIG 1000ML POUR BTL (IV SOLUTION) IMPLANT
PACK BASIN DAY SURGERY FS (CUSTOM PROCEDURE TRAY) ×2 IMPLANT
PENCIL BUTTON HOLSTER BLD 10FT (ELECTRODE) ×2 IMPLANT
SHEET MEDIUM DRAPE 40X70 STRL (DRAPES) ×2 IMPLANT
SLEEVE SCD COMPRESS KNEE MED (MISCELLANEOUS) ×2 IMPLANT
SPONGE GAUZE 4X4 12PLY (GAUZE/BANDAGES/DRESSINGS) IMPLANT
SPONGE INTESTINAL PEANUT (DISPOSABLE) IMPLANT
SPONGE LAP 4X18 X RAY DECT (DISPOSABLE) ×2 IMPLANT
STAPLER VISISTAT 35W (STAPLE) IMPLANT
SUT MNCRL AB 4-0 PS2 18 (SUTURE) ×2 IMPLANT
SUT SILK 0 TIES 10X30 (SUTURE) IMPLANT
SUT VICRYL 3-0 CR8 SH (SUTURE) ×2 IMPLANT
SYR CONTROL 10ML LL (SYRINGE) ×2 IMPLANT
TOWEL OR NON WOVEN STRL DISP B (DISPOSABLE) ×2 IMPLANT
TUBE CONNECTING 20X1/4 (TUBING) ×2 IMPLANT
WATER STERILE IRR 1000ML POUR (IV SOLUTION) ×2 IMPLANT
YANKAUER SUCT BULB TIP NO VENT (SUCTIONS) ×2 IMPLANT

## 2012-06-24 NOTE — Anesthesia Procedure Notes (Signed)
Procedure Name: LMA Insertion Date/Time: 06/24/2012 9:17 AM Performed by: Gar Gibbon Pre-anesthesia Checklist: Patient identified, Emergency Drugs available, Suction available and Patient being monitored Patient Re-evaluated:Patient Re-evaluated prior to inductionOxygen Delivery Method: Circle System Utilized Preoxygenation: Pre-oxygenation with 100% oxygen Intubation Type: IV induction Ventilation: Mask ventilation without difficulty LMA: LMA inserted LMA Size: 3.0 Number of attempts: 1 Airway Equipment and Method: bite block Placement Confirmation: positive ETCO2 Tube secured with: Tape Dental Injury: Teeth and Oropharynx as per pre-operative assessment

## 2012-06-24 NOTE — H&P (Signed)
.  Breast Mass     ADH on NCB   HPI  Ariel Mccall is a 56 y.o. female. She has a history of a right breast cancer treated with a mastectomy. She had a TRAM flap reconstruction. She usually had a mammogram in 2 areas abnormality were found in the left breast, one at the 12:00 and one at the 6:00 position. Both of them biopsied. The area at 6:00 is benign fibrocystic change. The area at 12:00 shows ADH and surgical excision was recommended. She is having no breast symptoms. She presents today for wire localized lumpectomy HPI  Past Medical History   Diagnosis  Date   .  Hx breast cancer, IDC, Right, Grade I, triple -  04/07/2010   .  Cancer     Past Surgical History   Procedure  Date   .  Breast surgery  09/2009     05/2010   .  Back surgery  09/2007    Family History   Problem  Relation  Age of Onset   .  Cancer  Mother       breast/skin    .  Cancer  Father       basal cell    .  Heart disease  Father    Social History  History   Substance Use Topics   .  Smoking status:  Never Smoker   .  Smokeless tobacco:  Not on file   .  Alcohol Use:  0.0 oz/week     2-4 Glasses of wine per week    Allergies   Allergen  Reactions   .  Contrast Media (Iodinated Diagnostic Agents)      Sneezing,draining, face flushed   .  Gadolinium      Code: SNEEZE, Desc: PT EXPERIENCED SNEEZING, SWELLING OF LIPS, COUGHING AND HIVES, GIVEN PO BENADRYL, Onset Date: 16109604                                                                 Review of Systems  Review of Systems  Constitutional: Negative for fever, chills and unexpected weight change.  HENT: Negative for hearing loss, congestion, sore throat, trouble swallowing and voice change.  Eyes: Negative for visual disturbance.  Respiratory: Positive for cough. Negative for wheezing.   Cardiovascular: Negative for chest pain, palpitations and leg swelling.  Gastrointestinal: Negative for nausea, vomiting, abdominal pain, diarrhea,  constipation, blood in stool, abdominal distention and anal bleeding.  Genitourinary: Negative for hematuria, vaginal bleeding and difficulty urinating.  Musculoskeletal: Negative for arthralgias.  Skin: Negative for rash and wound.  Neurological: Negative for seizures, syncope and headaches.  Hematological: Negative for adenopathy. Does not bruise/bleed easily.  Psychiatric/Behavioral: Negative for confusion.   Physical Exam  Physical Exam  Vitals reviewed. BP 134/84  Pulse 66  Temp 98.1 F (36.7 C) (Oral)  Resp 18  Ht 5' 2.25" (1.581 m)  Wt 150 lb (68.04 kg)  BMI 27.22 kg/m2  SpO2 98%  LMP 05/21/2012  Constitutional: She is oriented to person, place, and time. She appears well-developed and well-nourished. No distress.  HENT:  Head: Normocephalic and atraumatic.  Mouth/Throat: Oropharynx is clear and moist.  Eyes: Conjunctivae normal and EOM are normal. Pupils are equal, round, and reactive to light.  No scleral icterus.  Neck: Normal range of motion. Neck supple. No tracheal deviation present. No thyromegaly present.  Cardiovascular: Normal rate, regular rhythm, normal heart sounds and intact distal pulses. Exam reveals no gallop and no friction rub.  No murmur heard.  Pulmonary/Chest: Effort normal and breath sounds normal. No respiratory distress. She has no wheezes. She has no rales.    TRAM on right. Guide wire in situ left  Abdominal: Soft. Bowel sounds are normal. She exhibits no distension and no mass. There is no tenderness. There is no rebound and no guarding.  Musculoskeletal: Normal range of motion. She exhibits no edema and no tenderness.  Lymphadenopathy:  She has no cervical adenopathy.  She has no axillary adenopathy.  Right: No supraclavicular adenopathy present.  Neurological: She is alert and oriented to person, place, and time.  Skin: Skin is warm and dry. No rash noted. She is not diaphoretic. No erythema.  Psychiatric: She has a normal mood and affect.  Her behavior is normal. Judgment and thought content normal.  Data Reviewed  I have reviewed the old medical record, the mammogram reports, and the pathology report and the wire loc films Assessment  1. 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 History of right breast cancer, status post mastectomy and TRAM flap reconstruction, no evidence of local recurrence  Plan  We will proceed with surgical excision of the area in the left breast showing ADH on core bx.  I have marked the left breast as the operative side

## 2012-06-24 NOTE — Transfer of Care (Signed)
Immediate Anesthesia Transfer of Care Note  Patient: Ariel Mccall  Procedure(s) Performed: Procedure(s) (LRB) with comments: BREAST LUMPECTOMY WITH NEEDLE LOCALIZATION (Left) - removal left breast mass with needle localization  Patient Location: PACU  Anesthesia Type:General  Level of Consciousness: awake, alert  and patient cooperative  Airway & Oxygen Therapy: Patient Spontanous Breathing and Patient connected to face mask oxygen  Post-op Assessment: Report given to PACU RN and Post -op Vital signs reviewed and stable  Post vital signs: Reviewed and stable  Complications: No apparent anesthesia complications

## 2012-06-24 NOTE — Op Note (Signed)
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  Description of procedure: The patient was seen in the holding area and the plans for the procedure reviewed. The left breast was marked as the operative side. The wire localizing films were reviewed.on the films, the guidewire appeared to track from superior to inferior. With the patient supine in looking at the breast the wire seemed to track more medial to lateral.  The patient was taken to the operating room and after satisfactory general anesthesia had been obtained the left breast was prepped and draped and the timeout was performed.  The incision was made over the presumed area of the abnormality. Skin flaps were raised and using cautery the area was completely excised. Once the flap was raised I was able to manipulate the guidewire into the wound. I took a wide cylinder of tissue around it. The patient was very dense and there was vigorous bleeding from several blood vessels that had to be coagulated. I'm not sure if this was just is fibrocystic change, or secondary reaction to the hematoma that she got after her original core biopsy. Alternatively, it could be tumor.Bleeders were controlled with either cautery or sutures as needed.  After achieving hemostasis, the incision was closed with 3-0 Vicryl, 4-0 Monocryl subcuticular, and Dermabond.I injected 30 cc of 0.25% plain Marcaine to help with postop anesthesia. The patient tolerated the procedure well. There were no operative complications. All counts were  correct.   EBL: about 20 cc.  Currie Paris, MD, FACS 06/24/2012 10:17 AM

## 2012-06-24 NOTE — Anesthesia Preprocedure Evaluation (Signed)
Anesthesia Evaluation  Patient identified by MRN, date of birth, ID band Patient awake    Reviewed: Allergy & Precautions, H&P , NPO status , Patient's Chart, lab work & pertinent test results  Airway Mallampati: II  Neck ROM: full    Dental   Pulmonary          Cardiovascular     Neuro/Psych  Headaches, Depression    GI/Hepatic GERD-  ,  Endo/Other    Renal/GU      Musculoskeletal   Abdominal   Peds  Hematology   Anesthesia Other Findings   Reproductive/Obstetrics                           Anesthesia Physical Anesthesia Plan  ASA: II  Anesthesia Plan: General   Post-op Pain Management:    Induction: Intravenous  Airway Management Planned: LMA  Additional Equipment:   Intra-op Plan:   Post-operative Plan:   Informed Consent: I have reviewed the patients History and Physical, chart, labs and discussed the procedure including the risks, benefits and alternatives for the proposed anesthesia with the patient or authorized representative who has indicated his/her understanding and acceptance.     Plan Discussed with: CRNA and Surgeon  Anesthesia Plan Comments:         Anesthesia Quick Evaluation

## 2012-06-24 NOTE — Anesthesia Postprocedure Evaluation (Signed)
Anesthesia Post Note  Patient: Ariel Mccall  Procedure(s) Performed: Procedure(s) (LRB): BREAST LUMPECTOMY WITH NEEDLE LOCALIZATION (Left)  Anesthesia type: General  Patient location: PACU  Post pain: Pain level controlled and Adequate analgesia  Post assessment: Post-op Vital signs reviewed, Patient's Cardiovascular Status Stable, Respiratory Function Stable, Patent Airway and Pain level controlled  Last Vitals:  Filed Vitals:   06/24/12 1024  BP:   Pulse: 80  Temp:   Resp: 14    Post vital signs: Reviewed and stable  Level of consciousness: awake, alert  and oriented  Complications: No apparent anesthesia complications

## 2012-06-25 ENCOUNTER — Encounter (HOSPITAL_BASED_OUTPATIENT_CLINIC_OR_DEPARTMENT_OTHER): Payer: Self-pay | Admitting: Surgery

## 2012-06-29 ENCOUNTER — Telehealth (INDEPENDENT_AMBULATORY_CARE_PROVIDER_SITE_OTHER): Payer: Self-pay | Admitting: Surgery

## 2012-06-30 NOTE — Telephone Encounter (Signed)
Reviwed path with patient. She has fairly extnesive dcis and I think will be best served by another mastectomy. Will discuss further at post op visit and will wait for input from Dr Darnelle Catalan

## 2012-07-01 ENCOUNTER — Other Ambulatory Visit: Payer: Self-pay | Admitting: Oncology

## 2012-07-02 ENCOUNTER — Telehealth (INDEPENDENT_AMBULATORY_CARE_PROVIDER_SITE_OTHER): Payer: Self-pay | Admitting: General Surgery

## 2012-07-02 NOTE — Telephone Encounter (Signed)
Per Dr Jamey Ripa he spoke with patient and she needs a mastectomy with SLN bx. She wants a reconstruction. She has seen Dr Odis Luster in the past about this and was to call him to set up an appt. I called patient and left message for her to call me back to see if she has appt with Dr Odis Luster yet or if she would like me to make it for her. Awaiting call back.

## 2012-07-02 NOTE — Telephone Encounter (Signed)
Message copied by Liliana Cline on Fri Jul 02, 2012  9:49 AM ------      Message from: EATMON, PAT      Created: Thu Jul 01, 2012  5:35 PM      Regarding: orders      Contact: [phone]       Patient called today to see about surgery date. Told patient we were waiting on orders from Dr Jamey Ripa after hearing from Dr Darnelle Catalan. Thanks pat e [phone]

## 2012-07-05 NOTE — Telephone Encounter (Signed)
Spoke with patient. She has appt with Dr Odis Luster on 07/21/2012 to talk about reconstruction.

## 2012-07-05 NOTE — Telephone Encounter (Signed)
Left another message on machine for patient to call back and ask for me.  

## 2012-07-06 ENCOUNTER — Ambulatory Visit (INDEPENDENT_AMBULATORY_CARE_PROVIDER_SITE_OTHER): Payer: BC Managed Care – PPO | Admitting: Surgery

## 2012-07-06 ENCOUNTER — Encounter (INDEPENDENT_AMBULATORY_CARE_PROVIDER_SITE_OTHER): Payer: Self-pay | Admitting: Surgery

## 2012-07-06 VITALS — BP 124/66 | HR 60 | Temp 97.5°F | Resp 16 | Ht 60.0 in | Wt 151.8 lb

## 2012-07-06 DIAGNOSIS — Z09 Encounter for follow-up examination after completed treatment for conditions other than malignant neoplasm: Secondary | ICD-10-CM

## 2012-07-06 DIAGNOSIS — C50919 Malignant neoplasm of unspecified site of unspecified female breast: Secondary | ICD-10-CM

## 2012-07-06 DIAGNOSIS — C50912 Malignant neoplasm of unspecified site of left female breast: Secondary | ICD-10-CM

## 2012-07-06 NOTE — Patient Instructions (Signed)
After you have seen a plastic surgeon, we will coordinate our schedules to do the mastectomy and any reconstruction that you plan.

## 2012-07-06 NOTE — Progress Notes (Signed)
NAME: Ariel Mccall                                            DOB: 06/05/1956 DATE: 07/06/2012                                                  MRN: [id number]  CC: Post op   HPI: This patient comes in for post op follow-up .Sheunderwent Wire localized left breast biopsy on 06/24/12. She feels that she is doing well.  PE:  VITAL SIGNS: BP 124/66  Pulse 60  Temp 97.5 F (36.4 C) (Temporal)  Resp 16  Ht 5' (1.524 m)  Wt 151 lb 12.8 oz (68.856 kg)  BMI 29.65 kg/m2  General: The patient appears to be healthy, NAD Breasts: The incision is healing nicely  DATA REVIEWED: Pathology report shows diffuse DCISit is recepto r negative   IMPRESSION: The patient is doing well S/P wire localized excisional biopsy.    PLAN: I have reviewed her path with her. 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 She is already had a contralateral breast cancer. My recommendation was for a total mastectomy with sentinel lymph node. I think she would be a good candidate for immediate reconstruction she already has a plastic surgery appointment pending. She would like to defer any surgery to the first week of January if possible. I think that would be okay. She also has a followup with her medical oncologist scheduled for sometime in December.

## 2012-07-07 ENCOUNTER — Ambulatory Visit: Payer: BC Managed Care – PPO | Admitting: Oncology

## 2012-07-09 ENCOUNTER — Telehealth: Payer: Self-pay | Admitting: *Deleted

## 2012-07-09 NOTE — Telephone Encounter (Signed)
Left message for patient to return my call so I can schedule a genetic appt.

## 2012-07-12 ENCOUNTER — Telehealth: Payer: Self-pay | Admitting: *Deleted

## 2012-07-12 NOTE — Telephone Encounter (Signed)
Left message for pt to return my call so I can schedule a genetic appt.  

## 2012-07-22 ENCOUNTER — Other Ambulatory Visit (INDEPENDENT_AMBULATORY_CARE_PROVIDER_SITE_OTHER): Payer: Self-pay | Admitting: Surgery

## 2012-07-22 DIAGNOSIS — C50912 Malignant neoplasm of unspecified site of left female breast: Secondary | ICD-10-CM

## 2012-07-23 DIAGNOSIS — Z87828 Personal history of other (healed) physical injury and trauma: Secondary | ICD-10-CM

## 2012-07-23 HISTORY — DX: Personal history of other (healed) physical injury and trauma: Z87.828

## 2012-07-26 ENCOUNTER — Telehealth: Payer: Self-pay | Admitting: *Deleted

## 2012-07-26 NOTE — Telephone Encounter (Signed)
Patient called w/ surgery dates and they interfere with her genetic appt and so I confirmed rescheduled appt 10/04/12 w/ pt.

## 2012-08-02 ENCOUNTER — Encounter (HOSPITAL_COMMUNITY): Payer: Self-pay | Admitting: Pharmacy Technician

## 2012-08-04 ENCOUNTER — Ambulatory Visit (INDEPENDENT_AMBULATORY_CARE_PROVIDER_SITE_OTHER): Payer: BC Managed Care – PPO | Admitting: Surgery

## 2012-08-04 ENCOUNTER — Encounter (INDEPENDENT_AMBULATORY_CARE_PROVIDER_SITE_OTHER): Payer: Self-pay | Admitting: Surgery

## 2012-08-04 ENCOUNTER — Encounter (HOSPITAL_COMMUNITY): Payer: Self-pay

## 2012-08-04 ENCOUNTER — Encounter (HOSPITAL_COMMUNITY)
Admission: RE | Admit: 2012-08-04 | Discharge: 2012-08-04 | Disposition: A | Discharge status: Home / Self Care | Payer: BC Managed Care – PPO | Source: Ambulatory Visit | Attending: Surgery | Admitting: Surgery

## 2012-08-04 VITALS — BP 122/82 | HR 70 | Temp 98.1°F | Resp 12 | Ht 62.0 in | Wt 152.2 lb

## 2012-08-04 DIAGNOSIS — C50919 Malignant neoplasm of unspecified site of unspecified female breast: Secondary | ICD-10-CM

## 2012-08-04 DIAGNOSIS — C50912 Malignant neoplasm of unspecified site of left female breast: Secondary | ICD-10-CM

## 2012-08-04 LAB — BASIC METABOLIC PANEL
BUN: 22 mg/dL (ref 6–23)
CO2: 28 mEq/L (ref 19–32)
Calcium: 9.7 mg/dL (ref 8.4–10.5)
Chloride: 98 mEq/L (ref 96–112)
Creatinine, Ser: 0.72 mg/dL (ref 0.50–1.10)
GFR calc Af Amer: >90 mL/min (ref >90)
GFR calc non Af Amer: >90 mL/min (ref >90)
Glucose, Bld: 89 mg/dL (ref 70–99)
Potassium: 4.5 mEq/L (ref 3.5–5.1)
Sodium: 136 mEq/L (ref 135–145)

## 2012-08-04 LAB — CBC
Hemoglobin: 13.1 g/dL (ref 12.0–15.0)
Platelets: 326 10*3/uL (ref 150–400)
RBC: 4.12 MIL/uL (ref 3.87–5.11)
WBC: 7.1 10*3/uL (ref 4.0–10.5)

## 2012-08-04 LAB — SURGICAL PCR SCREEN
MRSA, PCR: NEGATIVE
Staphylococcus aureus: NEGATIVE

## 2012-08-04 LAB — HCG, SERUM, QUALITATIVE: Preg, Serum: NEGATIVE

## 2012-08-04 MED ORDER — CHLORHEXIDINE GLUCONATE 4 % EX LIQD: 1.0000 "application " | Freq: Once | CUTANEOUS | Status: DC

## 2012-08-04 MED ORDER — CHLORHEXIDINE GLUCONATE 4 % EX LIQD: CUTANEOUS | Status: DC

## 2012-08-04 NOTE — Pre-Procedure Instructions (Signed)
20 NEENAH CANTER  08/04/2012   Your procedure is scheduled on:  Thursday August 12, 2012  Report to [facility] at 8:00 AM.  Call this number if you have problems the morning of surgery: [phone]   Remember:   Do not eat food or drink :After Midnight.    Take these medicines the morning of surgery with A SIP OF WATER: celexa, hydrocodone, methocarbamol, omeprazole,    Do not wear jewelry, make-up or nail polish.  Do not wear lotions, powders, or perfumes.  Do not shave 48 hours prior to surgery.  Do not bring valuables to the hospital.  Contacts, dentures or bridgework may not be worn into surgery.  Leave suitcase in the car. After surgery it may be brought to your room.  For patients admitted to the hospital, checkout time is 11:00 AM the day of discharge.   Patients discharged the day of surgery will not be allowed to drive home.  Name and phone number of your driver: family / friend  Special Instructions: Shower using CHG 2 nights before surgery and the night before surgery.  If you shower the day of surgery use CHG.  Use special wash - you have one bottle of CHG for all showers.  You should use approximately 1/3 of the bottle for each shower.   Please read over the following fact sheets that you were given: Pain Booklet, Coughing and Deep Breathing, MRSA Information and Surgical Site Infection Prevention

## 2012-08-04 NOTE — Progress Notes (Signed)
Called Dr. Tenna Child office and requested orders for preadmission.

## 2012-08-04 NOTE — Patient Instructions (Addendum)
I will see you for surgery next Thursday. Call if any questions before then.

## 2012-08-04 NOTE — Progress Notes (Signed)
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 After lengthy discussion the best option appears to be for a mastectomy with sentinel lymph node. She plans an immediate reconstruction with a latissimus flap to be done by the plastic surgeon.  She is doing well from her excisional biopsy with no particular problems. She's not had any intercurrent illnesses.  Exam: VITAL SIGNS: BP 122/82  Pulse 70  Temp 98.1 F (36.7 C)  Resp 12  Ht 5\' 2"  (1.575 m)  Wt 152 lb 3.2 oz (69.037 kg)  BMI 27.84 kg/m2  LMP 06/25/2012 GENERAL:  The patient is alert, oriented, and generally healthy-appearing, NAD. Mood and affect are normal.  HEENT:  The head is normocephalic, the eyes nonicteric, the pupils were round regular and equal. EOMs are normal. Pharynx normal. Dentition good.  NECK:  The neck is supple and there are no masses or thyromegaly.  LUNGS: Normal respirations and clear to auscultation.  HEART: Regular rhythm, with no murmurs rubs or gallops. Pulses are intact carotid dorsalis pedis and posterior tibial. No significant varicosities are noted.  BREASTS:  The right breast is surgically absent with an excellent TRAM flap reconstruction and no evidence of mass or local recurrence. The left breast shows a healing incision from her lumpectomy with no evidence of infection or other problems.  LYMPHATICS: There is no axillary she clavicular adenopathy  ABDOMEN: Soft, flat, and nontender. No masses or organomegaly is noted. No hernias are noted. Bowel sounds are normal.  EXTREMITIES:  Good range of motion, no edema.   Impression: DCIS left breast incompletely resected; history of right breast cancer status post mastectomy and TRAM flap reconstruction, no evidence of recurrence  Plan: We'll proceed as we have scheduled with a total mastectomy and sentinel lymph  node evaluation. All questions have been answered.

## 2012-08-10 NOTE — Progress Notes (Signed)
Instructed to arrive At 0730, I spoke to patient.

## 2012-08-11 MED ORDER — CEFAZOLIN SODIUM-DEXTROSE 2-3 GM-% IV SOLR
2.0000 g | Freq: Once | INTRAVENOUS | Status: AC
  Administered 2012-08-12: 2 g via INTRAVENOUS
  Administered 2012-08-12: 1 g via INTRAVENOUS
  Filled 2012-08-11: qty 50

## 2012-08-12 ENCOUNTER — Encounter (HOSPITAL_COMMUNITY): Payer: Self-pay | Admitting: Certified Registered Nurse Anesthetist

## 2012-08-12 ENCOUNTER — Inpatient Hospital Stay (HOSPITAL_COMMUNITY)
Admission: RE | Admit: 2012-08-12 | Discharge: 2012-08-14 | DRG: 258 | Disposition: A | Discharge status: Home / Self Care | Payer: BC Managed Care – PPO | Source: Ambulatory Visit | Attending: Surgery | Admitting: Surgery

## 2012-08-12 ENCOUNTER — Encounter (HOSPITAL_COMMUNITY)
Admission: RE | Admit: 2012-08-12 | Discharge: 2012-08-12 | Disposition: A | Discharge status: Home / Self Care | Payer: BC Managed Care – PPO | Source: Ambulatory Visit | Attending: Surgery | Admitting: Surgery

## 2012-08-12 ENCOUNTER — Encounter (HOSPITAL_COMMUNITY): Payer: Self-pay | Admitting: General Practice

## 2012-08-12 ENCOUNTER — Ambulatory Visit (HOSPITAL_COMMUNITY): Payer: BC Managed Care – PPO | Admitting: Certified Registered Nurse Anesthetist

## 2012-08-12 ENCOUNTER — Encounter (HOSPITAL_COMMUNITY)
Admission: RE | Disposition: A | Discharge status: Home / Self Care | Payer: Self-pay | Source: Ambulatory Visit | Attending: Surgery

## 2012-08-12 DIAGNOSIS — C50919 Malignant neoplasm of unspecified site of unspecified female breast: Secondary | ICD-10-CM | POA: Insufficient documentation

## 2012-08-12 DIAGNOSIS — R51 Headache: Secondary | ICD-10-CM | POA: Diagnosis present

## 2012-08-12 DIAGNOSIS — F329 Major depressive disorder, single episode, unspecified: Secondary | ICD-10-CM | POA: Diagnosis present

## 2012-08-12 DIAGNOSIS — Z853 Personal history of malignant neoplasm of breast: Secondary | ICD-10-CM

## 2012-08-12 DIAGNOSIS — Z79899 Other long term (current) drug therapy: Secondary | ICD-10-CM

## 2012-08-12 DIAGNOSIS — R110 Nausea: Secondary | ICD-10-CM | POA: Diagnosis not present

## 2012-08-12 DIAGNOSIS — F3289 Other specified depressive episodes: Secondary | ICD-10-CM | POA: Diagnosis present

## 2012-08-12 DIAGNOSIS — C50912 Malignant neoplasm of unspecified site of left female breast: Secondary | ICD-10-CM

## 2012-08-12 DIAGNOSIS — D0590 Unspecified type of carcinoma in situ of unspecified breast: Secondary | ICD-10-CM

## 2012-08-12 DIAGNOSIS — K219 Gastro-esophageal reflux disease without esophagitis: Secondary | ICD-10-CM | POA: Diagnosis present

## 2012-08-12 HISTORY — PX: BREAST RECONSTRUCTION WITH PLACEMENT OF TISSUE EXPANDER AND FLEX HD (ACELLULAR HYDRATED DERMIS): SHX6295

## 2012-08-12 HISTORY — PX: LATISSIMUS FLAP TO BREAST: SHX5357

## 2012-08-12 HISTORY — PX: SIMPLE MASTECTOMY WITH AXILLARY SENTINEL NODE BIOPSY: SHX6098

## 2012-08-12 HISTORY — DX: Basal cell carcinoma of skin of lip: C44.01

## 2012-08-12 HISTORY — DX: Malignant neoplasm of unspecified site of unspecified female breast: C50.919

## 2012-08-12 HISTORY — DX: Basal cell carcinoma of skin of nose: C44.311

## 2012-08-12 HISTORY — DX: Migraine, unspecified, not intractable, without status migrainosus: G43.909

## 2012-08-12 SURGERY — SIMPLE MASTECTOMY WITH AXILLARY SENTINEL NODE BIOPSY
Anesthesia: General | Site: Breast | Laterality: Left | Wound class: Clean

## 2012-08-12 MED ORDER — SODIUM CHLORIDE 0.9 % IR SOLN
Status: DC | PRN
  Administered 2012-08-12 (×4): 1000 mL

## 2012-08-12 MED ORDER — BUPIVACAINE-EPINEPHRINE 0.5% -1:200000 IJ SOLN: INTRAMUSCULAR | Status: DC | PRN

## 2012-08-12 MED ORDER — CEFAZOLIN SODIUM 1-5 GM-% IV SOLN
INTRAVENOUS | Status: AC
  Filled 2012-08-12: qty 50

## 2012-08-12 MED ORDER — CEFAZOLIN SODIUM-DEXTROSE 2-3 GM-% IV SOLR: 2.0000 g | INTRAVENOUS | Status: DC

## 2012-08-12 MED ORDER — CITALOPRAM HYDROBROMIDE 20 MG PO TABS
20.0000 mg | ORAL_TABLET | Freq: Every day | ORAL | Status: DC
  Administered 2012-08-13 – 2012-08-14 (×2): 20 mg via ORAL
  Filled 2012-08-12 (×2): qty 1

## 2012-08-12 MED ORDER — BUPIVACAINE HCL 0.5 % IJ SOLN
INTRAMUSCULAR | Status: DC | PRN
  Administered 2012-08-12: 40 mL

## 2012-08-12 MED ORDER — ONDANSETRON HCL 4 MG/2ML IJ SOLN: 4.0000 mg | Freq: Once | INTRAMUSCULAR | Status: DC | PRN

## 2012-08-12 MED ORDER — HYDROMORPHONE HCL PF 1 MG/ML IJ SOLN
0.2500 mg | INTRAMUSCULAR | Status: DC | PRN
  Administered 2012-08-12 (×3): 0.5 mg via INTRAVENOUS

## 2012-08-12 MED ORDER — PANTOPRAZOLE SODIUM 40 MG PO TBEC
40.0000 mg | DELAYED_RELEASE_TABLET | Freq: Every day | ORAL | Status: DC
  Administered 2012-08-12 – 2012-08-14 (×3): 40 mg via ORAL
  Filled 2012-08-12 (×3): qty 1

## 2012-08-12 MED ORDER — GLYCOPYRROLATE 0.2 MG/ML IJ SOLN
INTRAMUSCULAR | Status: DC | PRN
  Administered 2012-08-12: .8 mg via INTRAVENOUS

## 2012-08-12 MED ORDER — DEXTROSE-NACL 5-0.45 % IV SOLN
INTRAVENOUS | Status: DC
  Administered 2012-08-12: 1000 mL via INTRAVENOUS
  Administered 2012-08-13: 125 mL/h via INTRAVENOUS
  Administered 2012-08-13: 1000 mL via INTRAVENOUS

## 2012-08-12 MED ORDER — SODIUM CHLORIDE 0.9 % IJ SOLN: INTRAMUSCULAR | Status: DC | PRN

## 2012-08-12 MED ORDER — METHOCARBAMOL 500 MG PO TABS
500.0000 mg | ORAL_TABLET | Freq: Four times a day (QID) | ORAL | Status: DC
  Administered 2012-08-13 – 2012-08-14 (×6): 500 mg via ORAL
  Filled 2012-08-12 (×11): qty 1

## 2012-08-12 MED ORDER — ONDANSETRON HCL 4 MG/2ML IJ SOLN
4.0000 mg | Freq: Four times a day (QID) | INTRAMUSCULAR | Status: DC | PRN
  Administered 2012-08-13: 4 mg via INTRAVENOUS
  Filled 2012-08-12: qty 2

## 2012-08-12 MED ORDER — MIDAZOLAM HCL 5 MG/5ML IJ SOLN
INTRAMUSCULAR | Status: DC | PRN
  Administered 2012-08-12: 2 mg via INTRAVENOUS

## 2012-08-12 MED ORDER — OXYCODONE HCL 5 MG PO TABS: 5.0000 mg | ORAL_TABLET | Freq: Once | ORAL | Status: DC | PRN

## 2012-08-12 MED ORDER — EPHEDRINE SULFATE 50 MG/ML IJ SOLN
INTRAMUSCULAR | Status: DC | PRN
  Administered 2012-08-12: 10 mg via INTRAVENOUS

## 2012-08-12 MED ORDER — OXYCODONE HCL 5 MG/5ML PO SOLN: 5.0000 mg | Freq: Once | ORAL | Status: DC | PRN

## 2012-08-12 MED ORDER — DIPHENHYDRAMINE HCL 50 MG/ML IJ SOLN: 12.5000 mg | Freq: Four times a day (QID) | INTRAMUSCULAR | Status: DC | PRN

## 2012-08-12 MED ORDER — BUPIVACAINE HCL (PF) 0.5 % IJ SOLN
INTRAMUSCULAR | Status: AC
  Filled 2012-08-12: qty 60

## 2012-08-12 MED ORDER — DOCUSATE SODIUM 100 MG PO CAPS
100.0000 mg | ORAL_CAPSULE | Freq: Every day | ORAL | Status: DC
  Administered 2012-08-12 – 2012-08-14 (×3): 100 mg via ORAL
  Filled 2012-08-12 (×4): qty 1

## 2012-08-12 MED ORDER — ROCURONIUM BROMIDE 100 MG/10ML IV SOLN
INTRAVENOUS | Status: DC | PRN
  Administered 2012-08-12: 50 mg via INTRAVENOUS
  Administered 2012-08-12: 25 mg via INTRAVENOUS
  Administered 2012-08-12: 50 mg via INTRAVENOUS

## 2012-08-12 MED ORDER — PROPOFOL 10 MG/ML IV BOLUS
INTRAVENOUS | Status: DC | PRN
  Administered 2012-08-12: 100 mg via INTRAVENOUS

## 2012-08-12 MED ORDER — HYDROMORPHONE 0.3 MG/ML IV SOLN
INTRAVENOUS | Status: DC
  Administered 2012-08-12: 0.4 mg via INTRAVENOUS
  Administered 2012-08-12: 1.79 mg via INTRAVENOUS
  Administered 2012-08-13: 0.77 mg via INTRAVENOUS
  Administered 2012-08-13: 0.79 mg via INTRAVENOUS
  Administered 2012-08-13: 0.59 mg via INTRAVENOUS

## 2012-08-12 MED ORDER — DIPHENHYDRAMINE HCL 12.5 MG/5ML PO ELIX
12.5000 mg | ORAL_SOLUTION | Freq: Four times a day (QID) | ORAL | Status: DC | PRN
  Filled 2012-08-12: qty 5

## 2012-08-12 MED ORDER — SODIUM CHLORIDE 0.9 % IR SOLN
Status: DC | PRN
  Administered 2012-08-12: 09:00:00

## 2012-08-12 MED ORDER — SODIUM CHLORIDE 0.9 % IJ SOLN
INTRAMUSCULAR | Status: DC | PRN
  Administered 2012-08-12: 60 mL

## 2012-08-12 MED ORDER — NALOXONE HCL 0.4 MG/ML IJ SOLN: 0.4000 mg | INTRAMUSCULAR | Status: DC | PRN

## 2012-08-12 MED ORDER — HYDROMORPHONE HCL PF 1 MG/ML IJ SOLN
INTRAMUSCULAR | Status: AC
  Filled 2012-08-12: qty 1

## 2012-08-12 MED ORDER — METHYLENE BLUE 1 % INJ SOLN
INTRAMUSCULAR | Status: AC
  Filled 2012-08-12: qty 10

## 2012-08-12 MED ORDER — LIDOCAINE HCL (CARDIAC) 20 MG/ML IV SOLN
INTRAVENOUS | Status: DC | PRN
  Administered 2012-08-12: 100 mg via INTRAVENOUS

## 2012-08-12 MED ORDER — TECHNETIUM TC 99M SULFUR COLLOID FILTERED
1.0000 | Freq: Once | INTRAVENOUS | Status: AC | PRN
Start: 2012-08-12 — End: 2012-08-12
  Administered 2012-08-12: 1 via INTRADERMAL

## 2012-08-12 MED ORDER — ONDANSETRON HCL 4 MG/2ML IJ SOLN
INTRAMUSCULAR | Status: DC | PRN
  Administered 2012-08-12 (×2): 4 mg via INTRAVENOUS

## 2012-08-12 MED ORDER — PROMETHAZINE HCL 25 MG/ML IJ SOLN
6.2500 mg | INTRAMUSCULAR | Status: DC | PRN
  Administered 2012-08-13 (×3): 6.25 mg via INTRAVENOUS
  Filled 2012-08-12 (×2): qty 1

## 2012-08-12 MED ORDER — LACTATED RINGERS IV SOLN
INTRAVENOUS | Status: DC | PRN
  Administered 2012-08-12 (×3): via INTRAVENOUS

## 2012-08-12 MED ORDER — BUPIVACAINE-EPINEPHRINE (PF) 0.5% -1:200000 IJ SOLN
INTRAMUSCULAR | Status: AC
  Filled 2012-08-12: qty 20

## 2012-08-12 MED ORDER — MEPERIDINE HCL 25 MG/ML IJ SOLN: 6.2500 mg | INTRAMUSCULAR | Status: DC | PRN

## 2012-08-12 MED ORDER — SODIUM CHLORIDE 0.9 % IJ SOLN
INTRAMUSCULAR | Status: DC | PRN
  Administered 2012-08-12: 09:00:00 via INTRAMUSCULAR

## 2012-08-12 MED ORDER — SODIUM CHLORIDE 0.9 % IJ SOLN: 9.0000 mL | INTRAMUSCULAR | Status: DC | PRN

## 2012-08-12 MED ORDER — CEFAZOLIN SODIUM 1-5 GM-% IV SOLN
1.0000 g | Freq: Three times a day (TID) | INTRAVENOUS | Status: DC
  Administered 2012-08-12 – 2012-08-14 (×6): 1 g via INTRAVENOUS
  Filled 2012-08-12 (×8): qty 50

## 2012-08-12 MED ORDER — LACTATED RINGERS IV SOLN: INTRAVENOUS | Status: DC

## 2012-08-12 MED ORDER — HYDROMORPHONE 0.3 MG/ML IV SOLN
INTRAVENOUS | Status: AC
  Administered 2012-08-12: 16:00:00
  Filled 2012-08-12: qty 25

## 2012-08-12 MED ORDER — NEOSTIGMINE METHYLSULFATE 1 MG/ML IJ SOLN
INTRAMUSCULAR | Status: DC | PRN
  Administered 2012-08-12: 5 mg via INTRAVENOUS

## 2012-08-12 MED ORDER — CITALOPRAM HYDROBROMIDE 10 MG PO TABS
10.0000 mg | ORAL_TABLET | Freq: Every day | ORAL | Status: DC
  Administered 2012-08-12 – 2012-08-13 (×2): 10 mg via ORAL
  Filled 2012-08-12 (×3): qty 1

## 2012-08-12 MED ORDER — FENTANYL CITRATE 0.05 MG/ML IJ SOLN
INTRAMUSCULAR | Status: DC | PRN
  Administered 2012-08-12 (×5): 50 ug via INTRAVENOUS

## 2012-08-12 SURGICAL SUPPLY — 96 items
ADH SKN CLS APL DERMABOND .7 (GAUZE/BANDAGES/DRESSINGS)
APPLIER CLIP 9.375 MED OPEN (MISCELLANEOUS) ×4
APPLIER CLIP 9.375 SM OPEN (CLIP)
APR CLP MED 9.3 20 MLT OPN (MISCELLANEOUS) ×2
APR CLP SM 9.3 20 MLT OPN (CLIP)
ATCH SMKEVC FLXB CAUT HNDSWH (FILTER) ×2 IMPLANT
BAG DECANTER FOR FLEXI CONT (MISCELLANEOUS) ×2 IMPLANT
BINDER BREAST LRG (GAUZE/BANDAGES/DRESSINGS) IMPLANT
BINDER BREAST XLRG (GAUZE/BANDAGES/DRESSINGS) ×1 IMPLANT
BIOPATCH RED 1 DISK 7.0 (GAUZE/BANDAGES/DRESSINGS) ×7 IMPLANT
BLADE SURG 15 STRL LF DISP TIS (BLADE) ×1 IMPLANT
BLADE SURG 15 STRL SS (BLADE) ×2
CANISTER SUCTION 2500CC (MISCELLANEOUS) ×4 IMPLANT
CHLORAPREP W/TINT 26ML (MISCELLANEOUS) ×4 IMPLANT
CLIP APPLIE 9.375 MED OPEN (MISCELLANEOUS) ×2 IMPLANT
CLIP APPLIE 9.375 SM OPEN (CLIP) IMPLANT
CLOTH BEACON ORANGE TIMEOUT ST (SAFETY) ×4 IMPLANT
CONT SPEC 4OZ CLIKSEAL STRL BL (MISCELLANEOUS) ×3 IMPLANT
COVER PROBE W GEL 5X96 (DRAPES) ×2 IMPLANT
COVER SURGICAL LIGHT HANDLE (MISCELLANEOUS) ×4 IMPLANT
DERMABOND ADVANCED (GAUZE/BANDAGES/DRESSINGS)
DERMABOND ADVANCED .7 DNX12 (GAUZE/BANDAGES/DRESSINGS) ×1 IMPLANT
DRAIN CHANNEL 19F RND (DRAIN) ×7 IMPLANT
DRAPE CHEST BREAST 15X10 FENES (DRAPES) ×4 IMPLANT
DRAPE INCISE 23X17 IOBAN STRL (DRAPES) ×1
DRAPE INCISE 23X17 STRL (DRAPES) ×1 IMPLANT
DRAPE INCISE IOBAN 23X17 STRL (DRAPES) ×1 IMPLANT
DRAPE ORTHO SPLIT 77X108 STRL (DRAPES) ×12
DRAPE PROXIMA HALF (DRAPES) ×4 IMPLANT
DRAPE SURG 17X23 STRL (DRAPES) ×17 IMPLANT
DRAPE SURG ORHT 6 SPLT 77X108 (DRAPES) ×4 IMPLANT
DRAPE UTILITY 15X26 W/TAPE STR (DRAPE) ×4 IMPLANT
DRAPE WARM FLUID 44X44 (DRAPE) ×2 IMPLANT
DRSG PAD ABDOMINAL 8X10 ST (GAUZE/BANDAGES/DRESSINGS) ×5 IMPLANT
DRSG TEGADERM 4X4.75 (GAUZE/BANDAGES/DRESSINGS) ×2 IMPLANT
ELECT BLADE 4.0 EZ CLEAN MEGAD (MISCELLANEOUS) ×2
ELECT BLADE 6.5 EXT (BLADE) IMPLANT
ELECT CAUTERY BLADE 6.4 (BLADE) ×6 IMPLANT
ELECT REM PT RETURN 9FT ADLT (ELECTROSURGICAL) ×4
ELECTRODE BLDE 4.0 EZ CLN MEGD (MISCELLANEOUS) ×1 IMPLANT
ELECTRODE REM PT RTRN 9FT ADLT (ELECTROSURGICAL) ×2 IMPLANT
EVACUATOR SILICONE 100CC (DRAIN) ×7 IMPLANT
EVACUATOR SMOKE ACCUVAC VALLEY (FILTER) ×2
GAUZE XEROFORM 5X9 LF (GAUZE/BANDAGES/DRESSINGS) IMPLANT
GLOVE BIO SURGEON STRL SZ 6.5 (GLOVE) ×1 IMPLANT
GLOVE BIO SURGEON STRL SZ7.5 (GLOVE) ×8 IMPLANT
GLOVE BIO SURGEON STRL SZ8 (GLOVE) ×1 IMPLANT
GLOVE BIOGEL PI IND STRL 6 (GLOVE) IMPLANT
GLOVE BIOGEL PI IND STRL 7.0 (GLOVE) IMPLANT
GLOVE BIOGEL PI IND STRL 8 (GLOVE) ×2 IMPLANT
GLOVE BIOGEL PI INDICATOR 6 (GLOVE) ×1
GLOVE BIOGEL PI INDICATOR 7.0 (GLOVE) ×1
GLOVE BIOGEL PI INDICATOR 8 (GLOVE) ×8
GLOVE EUDERMIC 7 POWDERFREE (GLOVE) ×2 IMPLANT
GLOVE SURG SS PI 6.5 STRL IVOR (GLOVE) ×2 IMPLANT
GOWN PREVENTION PLUS XLARGE (GOWN DISPOSABLE) ×8 IMPLANT
GOWN STRL NON-REIN LRG LVL3 (GOWN DISPOSABLE) ×8 IMPLANT
IMPLANT TISSUE EXPANDER MOD H (Tissue) ×1 IMPLANT
KIT BASIN OR (CUSTOM PROCEDURE TRAY) ×4 IMPLANT
KIT ROOM TURNOVER OR (KITS) ×4 IMPLANT
MARKER SKIN DUAL TIP RULER LAB (MISCELLANEOUS) ×2 IMPLANT
NDL 18GX1X1/2 (RX/OR ONLY) (NEEDLE) ×1 IMPLANT
NDL HYPO 25GX1X1/2 BEV (NEEDLE) ×1 IMPLANT
NDL SPNL 22GX3.5 QUINCKE BK (NEEDLE) ×1 IMPLANT
NEEDLE 18GX1X1/2 (RX/OR ONLY) (NEEDLE) ×2 IMPLANT
NEEDLE HYPO 25GX1X1/2 BEV (NEEDLE) ×2 IMPLANT
NEEDLE SPNL 22GX3.5 QUINCKE BK (NEEDLE) ×2 IMPLANT
NS IRRIG 1000ML POUR BTL (IV SOLUTION) ×6 IMPLANT
PACK GENERAL/GYN (CUSTOM PROCEDURE TRAY) ×4 IMPLANT
PAD ARMBOARD 7.5X6 YLW CONV (MISCELLANEOUS) ×6 IMPLANT
PENCIL BUTTON HOLSTER BLD 10FT (ELECTRODE) ×2 IMPLANT
PREFILTER EVAC NS 1 1/3-3/8IN (MISCELLANEOUS) ×2 IMPLANT
SET ASEPTIC TRANSFER (MISCELLANEOUS) ×1 IMPLANT
SPECIMEN JAR X LARGE (MISCELLANEOUS) ×2 IMPLANT
SPONGE GAUZE 4X4 12PLY (GAUZE/BANDAGES/DRESSINGS) ×3 IMPLANT
SPONGE LAP 18X18 X RAY DECT (DISPOSABLE) IMPLANT
SPONGE LAP 4X18 X RAY DECT (DISPOSABLE) ×2 IMPLANT
STAPLER VISISTAT 35W (STAPLE) ×4 IMPLANT
STRIP CLOSURE SKIN 1/2X4 (GAUZE/BANDAGES/DRESSINGS) ×3 IMPLANT
SUT ETHILON 2 0 FS 18 (SUTURE) ×2 IMPLANT
SUT MNCRL AB 3-0 PS2 18 (SUTURE) ×6 IMPLANT
SUT MON AB 2-0 CT1 36 (SUTURE) ×1 IMPLANT
SUT PDS AB 0 CT 36 (SUTURE) ×3 IMPLANT
SUT PROLENE 3 0 PS 1 (SUTURE) ×6 IMPLANT
SUT VIC AB 3-0 FS2 27 (SUTURE) IMPLANT
SUT VIC AB 3-0 SH 18 (SUTURE) ×2 IMPLANT
SUT VIC AB 3-0 SH 8-18 (SUTURE) ×4 IMPLANT
SYR 50ML LL SCALE MARK (SYRINGE) ×2 IMPLANT
SYR 50ML SLIP (SYRINGE) IMPLANT
SYR BULB IRRIGATION 50ML (SYRINGE) ×4 IMPLANT
SYR CONTROL 10ML LL (SYRINGE) ×2 IMPLANT
TAPE CLOTH SURG 6X10 WHT LF (GAUZE/BANDAGES/DRESSINGS) ×1 IMPLANT
TOWEL OR 17X24 6PK STRL BLUE (TOWEL DISPOSABLE) ×7 IMPLANT
TOWEL OR 17X26 10 PK STRL BLUE (TOWEL DISPOSABLE) ×4 IMPLANT
TRAY FOLEY CATH 14FRSI W/METER (CATHETERS) ×2 IMPLANT
TUBE CONNECTING 12X1/4 (SUCTIONS) ×4 IMPLANT

## 2012-08-12 NOTE — Brief Op Note (Signed)
08/12/2012  2:49 PM  PATIENT:  Ariel Mccall  56 y.o. female  PRE-OPERATIVE DIAGNOSIS:  left breast cancer  POST-OPERATIVE DIAGNOSIS:  left breast cancer  PROCEDURE:    SURGEON:  Surgeon(s) and Role:Left mastectomy and sentinel node ([place]) Left latissimus flap and placement tissue expander Odis Luster)   Panel 1:    * Currie Paris, MD - Primary    * Romie Levee, MD - Assisting  Panel 2:    * Etter Sjogren, MD - Primary  PHYSICIAN ASSISTANT:   ASSISTANTS: none   ANESTHESIA:   general  EBL:  Total I/O In: 2400 [I.V.:2400] Out: 585 [Urine:465; Blood:120]  BLOOD ADMINISTERED:none  DRAINS: (4) Jackson-Pratt drain(s) with closed bulb suction in the back donor site (2) and mastectomy site (2)   LOCAL MEDICATIONS USED:  NONE  SPECIMEN:  No Specimen  DISPOSITION OF SPECIMEN:  N/A  COUNTS:  YES  TOURNIQUET:  * No tourniquets in log *  DICTATION: .Other Dictation: Dictation Number [id number]  PLAN OF CARE: Admit to inpatient   PATIENT DISPOSITION:  PACU - hemodynamically stable.   Delay start of Pharmacological VTE agent (>24hrs) due to surgical blood loss or risk of bleeding: yes

## 2012-08-12 NOTE — Transfer of Care (Signed)
Immediate Anesthesia Transfer of Care Note  Patient: MARIACRISTINA ADAY  Procedure(s) Performed: Procedure(s) (LRB) with comments: SIMPLE MASTECTOMY WITH AXILLARY SENTINEL NODE BIOPSY (Left) - left total mastectomy and sentinel node biopsy LATISSIMUS FLAP TO BREAST (Left) - Left Latissimus Flap with Tissue Expander start 1148 BREAST RECONSTRUCTION WITH PLACEMENT OF TISSUE EXPANDER AND FLEX HD (ACELLULAR HYDRATED DERMIS) (Left)  Patient Location: PACU  Anesthesia Type:General  Level of Consciousness: awake, oriented, patient cooperative and responds to stimulation  Airway & Oxygen Therapy: Patient Spontanous Breathing and Patient connected to nasal cannula oxygen  Post-op Assessment: Report given to PACU RN and Post -op Vital signs reviewed and stable  Post vital signs: Reviewed and stable  Complications: No apparent anesthesia complications

## 2012-08-12 NOTE — Op Note (Signed)
Ariel Mccall May 18, 1956 [id number] 07/13/2012  Preoperative diagnosis: Left breast cancer, DCIS, clinical stage0  Postoperative diagnosis: same  Procedure: left total mastectomy with blue dye injection and axillary sentinel node excision  Surgeon: Currie Paris  Assistant surgeon: Dr Romie Levee  Anesthesia:General  Clinical History and Indications:The patient is seen in the holding area and we reviewed the plans for the procedure as noted above. We reviewed the risks and complications a final time. She had no further questions. I marked theleft side as the operative side.  Description of Procedure: The patient was taken to the operating room. After satisfactory general anesthesia was obtained the timeout was done.   I then injected 5 cc of dilute methylene blue and injected it subareaorly and massaged it in. A full prep and drape was then done.  I outlined an elliptical incision and marked the inframammary fold and midline. The incision was made. The usual skin flaps were raised. I went to the clavicle superiorly and sternum medially and towards the latissimus laterally.    After the superior flap was complete I was able to use the neoprobe to locate a sentinel node.  Two sentinel nodes were identified and removed. One was hot (2200) and blue, the other hot (300)  Once the nodse were removed they were forwarded to pathology.   The inferior flap was then made going to the inframammary fold and out to the latissimus. The breast was then removed from the pectoralis starting medially and working laterally. When I got to the lateral aspect of the pectoralis major muscle I opened the clavipectoral fascia. I finished removing the breast from the serratus muscles.  At this point the pathologist reported on the sentinel nodes and that it was negative.  I therefore completed the mastectomy by dividing the remaining attachments from the breast to the chest wall and latissimus but  not taking any more tissue from the axilla. The specimen was marked to orient it for the pathologist and passed off the table.  I spent several minutes irrigating and making sure everything was dry . A moistpack was place the the flaps loosely closed over the pack. Dr Odis Luster then came in to perform the reconstruction.  EBL minimal, all counts correct at the end of my portion of the procedure  Currie Paris, MD, FACS 08/12/2012 11:40 AM

## 2012-08-12 NOTE — Interval H&P Note (Signed)
History and Physical Interval Note:  08/12/2012 9:23 AM  Ariel Mccall  has presented today for surgery, with the diagnosis of left breast cancer  The various methods of treatment have been discussed with the patient and family. After consideration of risks, benefits and other options for treatment, the patient has consented to  Procedure(s) (LRB) with comments: SIMPLE MASTECTOMY WITH AXILLARY SENTINEL NODE BIOPSY (Left) - left total mastectomy and sentinel node biopsy LATISSIMUS FLAP TO BREAST (Left) - Left Latissimus Flap with Tissue Expander as a surgical intervention .  The patient's history has been reviewed, patient examined, no change in status, stable for surgery.  I have reviewed the patient's chart and labs.  Questions were answered to the patient's satisfaction.   The left breast is marked as the operative side  Ajee Heasley J

## 2012-08-12 NOTE — H&P (View-Only) (Signed)
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 After lengthy discussion the best option appears to be for a mastectomy with sentinel lymph node. She plans an immediate reconstruction with a latissimus flap to be done by the plastic surgeon.  She is doing well from her excisional biopsy with no particular problems. She's not had any intercurrent illnesses.  Exam: VITAL SIGNS: BP 122/82  Pulse 70  Temp 98.1 F (36.7 C)  Resp 12  Ht 5' 2" (1.575 m)  Wt 152 lb 3.2 oz (69.037 kg)  BMI 27.84 kg/m2  LMP 06/25/2012 GENERAL:  The patient is alert, oriented, and generally healthy-appearing, NAD. Mood and affect are normal.  HEENT:  The head is normocephalic, the eyes nonicteric, the pupils were round regular and equal. EOMs are normal. Pharynx normal. Dentition good.  NECK:  The neck is supple and there are no masses or thyromegaly.  LUNGS: Normal respirations and clear to auscultation.  HEART: Regular rhythm, with no murmurs rubs or gallops. Pulses are intact carotid dorsalis pedis and posterior tibial. No significant varicosities are noted.  BREASTS:  The right breast is surgically absent with an excellent TRAM flap reconstruction and no evidence of mass or local recurrence. The left breast shows a healing incision from her lumpectomy with no evidence of infection or other problems.  LYMPHATICS: There is no axillary she clavicular adenopathy  ABDOMEN: Soft, flat, and nontender. No masses or organomegaly is noted. No hernias are noted. Bowel sounds are normal.  EXTREMITIES:  Good range of motion, no edema.   Impression: DCIS left breast incompletely resected; history of right breast cancer status post mastectomy and TRAM flap reconstruction, no evidence of recurrence  Plan: We'll proceed as we have scheduled with a total mastectomy and sentinel lymph  node evaluation. All questions have been answered. 

## 2012-08-12 NOTE — Anesthesia Postprocedure Evaluation (Signed)
Anesthesia Post Note  Patient: Ariel Mccall  Procedure(s) Performed: Procedure(s) (LRB): SIMPLE MASTECTOMY WITH AXILLARY SENTINEL NODE BIOPSY (Left) LATISSIMUS FLAP TO BREAST (Left) BREAST RECONSTRUCTION WITH PLACEMENT OF TISSUE EXPANDER AND FLEX HD (ACELLULAR HYDRATED DERMIS) (Left)  Anesthesia type: general  Patient location: PACU  Post pain: Pain level controlled  Post assessment: Patient's Cardiovascular Status Stable  Last Vitals:  Filed Vitals:   08/12/12 1507  BP:   Pulse:   Temp: 36.3 C  Resp:     Post vital signs: Reviewed and stable  Level of consciousness: sedated  Complications: No apparent anesthesia complications

## 2012-08-12 NOTE — Preoperative (Signed)
Beta Blockers   Reason not to administer Beta Blockers:Not Applicable 

## 2012-08-12 NOTE — Anesthesia Preprocedure Evaluation (Addendum)
Anesthesia Evaluation  Patient identified by MRN, date of birth, ID band Patient awake    Reviewed: Allergy & Precautions, H&P , NPO status , Patient's Chart, lab work & pertinent test results, reviewed documented beta blocker date and time   History of Anesthesia Complications Negative for: history of anesthetic complications  Airway Mallampati: II TM Distance: <3 FB Neck ROM: Full    Dental  (+) Teeth Intact and Dental Advisory Given   Pulmonary neg pulmonary ROS,          Cardiovascular negative cardio ROS      Neuro/Psych  Headaches, PSYCHIATRIC DISORDERS Depression    GI/Hepatic Neg liver ROS, GERD-  Medicated and Controlled,  Endo/Other  negative endocrine ROS  Renal/GU negative Renal ROS     Musculoskeletal negative musculoskeletal ROS (+)   Abdominal   Peds  Hematology negative hematology ROS (+)   Anesthesia Other Findings   Reproductive/Obstetrics negative OB ROS                          Anesthesia Physical Anesthesia Plan  ASA: II  Anesthesia Plan: General   Post-op Pain Management:    Induction: Intravenous  Airway Management Planned: Oral ETT  Additional Equipment:   Intra-op Plan:   Post-operative Plan: Extubation in OR  Informed Consent: I have reviewed the patients History and Physical, chart, labs and discussed the procedure including the risks, benefits and alternatives for the proposed anesthesia with the patient or authorized representative who has indicated his/her understanding and acceptance.     Plan Discussed with: CRNA and Surgeon  Anesthesia Plan Comments:         Anesthesia Quick Evaluation

## 2012-08-13 MED ORDER — HYDROMORPHONE HCL 2 MG PO TABS
2.0000 mg | ORAL_TABLET | ORAL | Status: DC | PRN
  Administered 2012-08-13 – 2012-08-14 (×5): 4 mg via ORAL
  Filled 2012-08-13 (×5): qty 2

## 2012-08-13 MED ORDER — ACETAMINOPHEN 325 MG PO TABS
650.0000 mg | ORAL_TABLET | Freq: Four times a day (QID) | ORAL | Status: DC | PRN
  Administered 2012-08-13 – 2012-08-14 (×3): 650 mg via ORAL
  Filled 2012-08-13 (×3): qty 2

## 2012-08-13 MED ORDER — HEPARIN SODIUM (PORCINE) 5000 UNIT/ML IJ SOLN
5000.0000 [IU] | Freq: Three times a day (TID) | INTRAMUSCULAR | Status: DC
  Administered 2012-08-13 – 2012-08-14 (×3): 5000 [IU] via SUBCUTANEOUS
  Filled 2012-08-13 (×6): qty 1

## 2012-08-13 NOTE — Progress Notes (Signed)
Subjective: Has some nausea and headache. Pain is well controlled.  Objective: Vital signs in last 24 hours: Temp:  [97 F (36.1 C)-98.4 F (36.9 C)] 98.2 F (36.8 C) (12/27 0500) Pulse Rate:  [70-86] 71  (12/27 0500) Resp:  [8-24] 13  (12/27 0837) BP: (86-152)/(50-69) 152/69 mmHg (12/27 0500) SpO2:  [98 %-100 %] 99 % (12/27 0837) Weight:  [154 lb 8.7 oz (70.1 kg)] 154 lb 8.7 oz (70.1 kg) (12/26 1636)  Intake/Output from previous day: 12/26 0701 - 12/27 0700 In: 4394.2 [P.O.:440; I.V.:3954.2] Out: 2325 [Urine:1815; Drains:390; Blood:120] Intake/Output this shift: Total I/O In: -  Out: 625 [Urine:550; Drains:75]  Operative sites: Mastectomy flaps viable. Flap hasd good color throughout skin paddle.. Tissue expander in good position. Drains functioning. Drainage thin.  No results found for this basename: WBC:2,HGB:2,HCT:2,PLATELETS:2,NA:2,K:2,CL:2,CO2:2,BUN:2,CREATININE:2,GLU:2 in the last 72 hours  Studies/Results: Nm Sentinel Node Inj-no Rpt (breast)  08/12/2012  CLINICAL DATA: left breast cancer   Sulfur colloid was injected intradermally by the nuclear medicine  technologist for breast cancer sentinel node localization.      Assessment/Plan: D/C IV pain med and try PO. It may help the nausea. Tylenol for headache. D/C foley. Ambulate. DVT prophylaxis with subcu heparin.  LOS: 1 day    Etter Sjogren M 08/13/2012 9:53 AM

## 2012-08-13 NOTE — Progress Notes (Signed)
1 Day Post-Op   Assessment: s/p Procedure(s): SIMPLE MASTECTOMY WITH AXILLARY SENTINEL NODE BIOPSY LATISSIMUS FLAP TO BREAST BREAST RECONSTRUCTION WITH PLACEMENT OF TISSUE EXPANDER AND FLEX HD (ACELLULAR HYDRATED DERMIS) Patient Active Problem List  Diagnosis  . Hx breast cancer, IDC, Right, Grade I, triple -  . Cancer, Left breast, DCIS, receptor negative    Nausea psot op, otherwise doing well  Plan: Will need to keep till nausea improved and diet tolereated and pain controlled on oral meds  Subjective: Still a lot of nausea, pain control OK with current regime  Objective: Vital signs in last 24 hours: Temp:  [97 F (36.1 C)-98.4 F (36.9 C)] 98.2 F (36.8 C) (12/27 0500) Pulse Rate:  [70-86] 71  (12/27 0500) Resp:  [8-24] 13  (12/27 0837) BP: (86-152)/(50-69) 152/69 mmHg (12/27 0500) SpO2:  [98 %-100 %] 99 % (12/27 0837) Weight:  [154 lb 8.7 oz (70.1 kg)] 154 lb 8.7 oz (70.1 kg) (12/26 1636)   Intake/Output from previous day: 12/26 0701 - 12/27 0700 In: 4394.2 [P.O.:440; I.V.:3954.2] Out: 2325 [Urine:1815; Drains:390; Blood:120] Intake/Output this shift:     General appearance: alert, cooperative and mild distress Resp: clear to auscultation bilaterally  Incision: JP's all thin, did not remove dressing  Lab Results:  No results found for this basename: WBC:2,HGB:2,HCT:2,PLT:2 in the last 72 hours BMET No results found for this basename: NA:2,K:2,CL:2,CO2:2,GLUCOSE:2,BUN:2,CREATININE:2,CALCIUM:2 in the last 72 hours PT/INR No results found for this basename: LABPROT:2,INR:2 in the last 72 hours ABG No results found for this basename: PHART:2,PCO2:2,PO2:2,HCO3:2 in the last 72 hours  MEDS, Scheduled    .  ceFAZolin (ANCEF) IV  1 g Intravenous Q8H  . citalopram  10 mg Oral QHS  . citalopram  20 mg Oral Daily  . docusate sodium  100 mg Oral Daily  . HYDROmorphone PCA 0.3 mg/mL   Intravenous Q4H  . methocarbamol  500 mg Oral QID  . pantoprazole  40 mg  Oral Daily    Studies/Results: Nm Sentinel Node Inj-no Rpt (breast)  08/12/2012  CLINICAL DATA: left breast cancer   Sulfur colloid was injected intradermally by the nuclear medicine  technologist for breast cancer sentinel node localization.        LOS: 1 day     Currie Paris, MD, [facility], [place] [phone]   08/13/2012 9:26 AM

## 2012-08-13 NOTE — Op Note (Signed)
NAMESHAMIA, UPPAL              ACCOUNT NO.:  [id number]  MEDICAL RECORD NO.:  [id number]  LOCATION:  6N08C                        FACILITY:  [facility]  PHYSICIAN:  Etter Sjogren, M.D.     DATE OF BIRTH:  [birth year]  DATE OF PROCEDURE:  08/12/2012 DATE OF DISCHARGE:                              OPERATIVE REPORT   PREOPERATIVE DIAGNOSIS:  Left breast cancer.  POSTOPERATIVE DIAGNOSIS:  Left breast cancer.  PROCEDURE PERFORMED: 1. Left latissimus myocutaneous flap to left chest. 2. Distinct procedure placement of tissue expander.  SURGEON:  Etter Sjogren, M.D.  ANESTHESIA:  General.  ESTIMATED BLOOD LOSS:  50 mL.  DRAINS:  Four 19-French, 2 in the anterior chest, 2 in the back donor site.  CLINICAL NOTE:  A 56 year old woman has had breast cancer on the right side, had mastectomy reconstruction with TRAM flap.  She now presents with breast cancer left side and is for mastectomy and also reconstruction.  Options were discussed.  She realized the TRAM could only be performed one time and after looking photograph, she decide to go ahead with latissimus flap with eventual placement of implant to give her a little bit better symmetry with the opposite side as opposed to just a implant aligned.  The nature of procedure and risks plus complications were discussed with her in great detail.  She understood these risks included, but were not limited to, bleeding, infection, anesthesia complications, healing problems, scarring, loss of sensation, fluid accumulations, anesthesia-related complications, failure of device, capsular contracture, displacement device, wrinkles, ripples, pneumothorax, pulmonary embolism, loss of tissue, loss of portions all of the flap, loss of portions of the mastectomy flaps, asymmetry, disappointment, chronic pain, loss of range of motion and strength of the shoulder.  She understood all this and wished to proceed.  DESCRIPTION OF PROCEDURE:  The patient was  taken into the operating room.  Mastectomy completed.  The mastectomy wound had been closed over a lap soaked with antibiotic solution.  This was then covered with a sterile, clear drape to seal it.  She was then placed in a right lateral decubitus with axillary roll stabilized with a bean bag, and she was prepped with ChloraPrep and draped with sterile drapes including impervious drapes.  The skin paddle was incised.  The dissection carried up to the subcutaneous tissue beveling superior and inferior in order to include a broader attachment of tissue at the level of the muscle, then was present level of the skin and the improve the blood supply to the skin paddle.  The anterior and posterior, superior and inferior borders of muscle were identified and then the muscle was elevated from inferior to superior with great care taken to avoid damage to underlying structures.  Larger perforating vessels were either double or triple ligated with Ligaclips and divided or suture ligated with 3-0 Vicryl suture ligatures and divided.  The flap having been mobilized and was noted to have excellent color and the lap that was placed in the left chest was removed through a subcutaneous tunnel to the left chest and the flap was passed gently through this tunnel.  The donor defect was irrigated thoroughly with saline and hemostasis with electrocautery. Two 19-French  drains were positioned, brought through separate stab wounds and through inferiorly and secured with 3-0 Prolene sutures.  The closure with 0 PDS in inverted deep sutures, 2-0 Monocryl interrupted inverted deep dermal sutures and 3-0 Monocryl running subcuticular suture.  Steri-Strips, dry sterile dressing applied.  Bio patch is applied for the drains.  The patient was then placed in a supine position.  The sterile cover that had been placed at the conclusion the mastectomy was then removed once she was well positioned and Betadine prep was  then performed.  She was draped sterile drapes including impervious drapes.  The staples were removed.  The flap was inspected found in excellent condition.  Thorough irrigation with saline as well as antibiotic solution and hemostasis with electrocautery.  The muscle insetting 3-0 Vicryl interrupted sutures laterally and inferiorly and medially and then the expander which had been soaked in antibiotic solution for greater than 5 minutes was then prepared.  After thoroughly cleaning gloves, the expander was prepared by removing the air and placing 200 mL sterile saline using a closed filling system.  The expander was then positioned in the superior aspect of the muscle closure was performed using 3-0 Vicryl simple interrupted sutures with great care taken to avoid damage to underlying tissue expander which was kept under direct vision at all times.  A 19- French drains were positioned, one medially, one laterally and brought through separate stab wounds inferiorly and secured with 3-0 Prolene sutures.  The skin paddle was noted to have excellent color and bright red bleeding in its periphery consistent with viability.  The skin paddle insetting 3-0 Monocryl in inverted deep dermal sutures and running 3-0 Monocryl subcuticular suture.  Steri-Strips, and dry sterile dressing applied, and a bio patch is for the drains and she was transferred to recovery room stable having tolerated procedure well.     Etter Sjogren, M.D.     DB/MEDQ  D:  08/12/2012  T:  08/12/2012  Job:  [id number]

## 2012-08-14 MED ORDER — ENOXAPARIN SODIUM 40 MG/0.4ML ~~LOC~~ SOLN
40.0000 mg | SUBCUTANEOUS | Status: DC
  Administered 2012-08-14: 40 mg via SUBCUTANEOUS
  Filled 2012-08-14: qty 0.4

## 2012-08-14 MED ORDER — ENOXAPARIN (LOVENOX) PATIENT EDUCATION KIT
PACK | Freq: Once | Status: DC
  Filled 2012-08-14: qty 1

## 2012-08-14 MED ORDER — CEPHALEXIN 500 MG PO CAPS
500.0000 mg | ORAL_CAPSULE | Freq: Two times a day (BID) | ORAL | Status: DC
  Administered 2012-08-14: 500 mg via ORAL
  Filled 2012-08-14 (×2): qty 1

## 2012-08-14 MED ORDER — ENOXAPARIN SODIUM 40 MG/0.4ML ~~LOC~~ SOLN: 40.0000 mg | SUBCUTANEOUS | Status: DC

## 2012-08-14 MED ORDER — DSS 100 MG PO CAPS: 100.0000 mg | ORAL_CAPSULE | Freq: Two times a day (BID) | ORAL | Status: DC

## 2012-08-14 MED ORDER — HYDROMORPHONE HCL 2 MG PO TABS: 2.0000 mg | ORAL_TABLET | ORAL | Status: DC | PRN

## 2012-08-14 MED ORDER — BACITRACIN-NEOMYCIN-POLYMYXIN 400-5-5000 EX OINT
TOPICAL_OINTMENT | CUTANEOUS | Status: AC
  Filled 2012-08-14: qty 1

## 2012-08-14 MED ORDER — CEPHALEXIN 500 MG PO CAPS: 500.0000 mg | ORAL_CAPSULE | Freq: Two times a day (BID) | ORAL | Status: DC

## 2012-08-14 MED ORDER — METHOCARBAMOL 500 MG PO TABS: 500.0000 mg | ORAL_TABLET | Freq: Four times a day (QID) | ORAL | Status: DC

## 2012-08-14 NOTE — Progress Notes (Signed)
Pt given Breast Cancer Bag w/ JP measuring container, instruction sheet on how to empty JP drains/chart for recording output.  Pt has had drains before and understands how to empty them.

## 2012-08-14 NOTE — Progress Notes (Signed)
2 Days Post-Op   Assessment: s/p Procedure(s): SIMPLE MASTECTOMY WITH AXILLARY SENTINEL NODE BIOPSY LATISSIMUS FLAP TO BREAST BREAST RECONSTRUCTION WITH PLACEMENT OF TISSUE EXPANDER AND FLEX HD (ACELLULAR HYDRATED DERMIS) Patient Active Problem List  Diagnosis  . Hx breast cancer, IDC, Right, Grade I, triple -  . Cancer, Left breast, DCIS, receptor negative    Improved and can probably go home today after being seen by Dr Odis Luster  Plan: Discharge If OK with Dr Odis Luster  Subjective: Feels much better, no headache, no nausea, pain controlled on oral pain med, wants to go home today  Objective: Vital signs in last 24 hours: Temp:  [98.2 F (36.8 C)-98.7 F (37.1 C)] 98.6 F (37 C) (12/28 1610) Pulse Rate:  [65-69] 67  (12/28 0614) Resp:  [13-18] 18  (12/28 0614) BP: (147-161)/(51-75) 155/75 mmHg (12/28 0614) SpO2:  [94 %-100 %] 100 % (12/28 9604)   Intake/Output from previous day: 12/27 0701 - 12/28 0700 In: 2447.1 [P.O.:120; I.V.:2327.1] Out: 3333 [Urine:2950; Drains:383] Intake/Output this shift:     General appearance: alert, cooperative and no distress Resp: clear to auscultation bilaterally  Incision: Drains thin, drssing not removed  Lab Results:  No results found for this basename: WBC:2,HGB:2,HCT:2,PLT:2 in the last 72 hours BMET No results found for this basename: NA:2,K:2,CL:2,CO2:2,GLUCOSE:2,BUN:2,CREATININE:2,CALCIUM:2 in the last 72 hours PT/INR No results found for this basename: LABPROT:2,INR:2 in the last 72 hours ABG No results found for this basename: PHART:2,PCO2:2,PO2:2,HCO3:2 in the last 72 hours  MEDS, Scheduled    .  ceFAZolin (ANCEF) IV  1 g Intravenous Q8H  . citalopram  10 mg Oral QHS  . citalopram  20 mg Oral Daily  . docusate sodium  100 mg Oral Daily  . heparin subcutaneous  5,000 Units Subcutaneous Q8H  . methocarbamol  500 mg Oral QID  . pantoprazole  40 mg Oral Daily    Studies/Results: Nm Sentinel Node Inj-no Rpt  (breast)  08/12/2012  CLINICAL DATA: left breast cancer   Sulfur colloid was injected intradermally by the nuclear medicine  technologist for breast cancer sentinel node localization.        LOS: 2 days     Currie Paris, MD, [facility], [place] [phone]   08/14/2012 8:48 AM

## 2012-08-14 NOTE — Discharge Summary (Signed)
Physician Discharge Summary  Patient ID: Ariel Mccall MRN: [id number] DOB/AGE: [birth year] 56 y.o.  Admit date: 08/12/2012 Discharge date: 08/14/2012  Admission Diagnoses:Left breast cancer  Discharge Diagnoses: Same Principal Problem:  *Cancer, Left breast, DCIS, receptor negative   Discharged Condition: good  Hospital Course: On the day of admission the patient was taken to surgery and had left mastectomy and sentinel node with breast reconstruction using latissimus flap and expander. The patient tolerated the procedures well. Postoperatively, the flap maintained excellent color and capillary refill. The patient was ambulatory and tolerating diet on the first postoperative day. She did have nausea and headache. These resolved and she is ready for discharge.  Treatments: antibiotics: Ancef, anticoagulation: heparin and surgery: left mastectomy, sentinel node, latissimus flap and tissue expander  Discharge Exam: Blood pressure 155/75, pulse 67, temperature 98.6 F (37 C), temperature source Oral, resp. rate 18, height 5\' 2"  (1.575 m), weight 154 lb 8.7 oz (70.1 kg), last menstrual period 06/25/2012, SpO2 100.00%.  Operative sites: Mastectomy flaps viable. Flap good color throughout skin paddle and viable. Tissue expander good position. Drains functioning. Drainage thin and non-purulent. There is no evidence of bleeding or infection at either operative site..  Disposition: 01-Home or Self Care  Discharge Orders    Future Appointments: Provider: Department: Dept Phone: Center:   08/31/2012 4:00 PM Currie Paris, MD [facility], [place] [phone] None   10/04/2012 1:30 PM Linard Millers, [facility] MEDICAL ONCOLOGY [phone] None   10/04/2012 2:30 PM Radene Gunning [facility] MED[facility]COLOGY [phone] None   10/18/2012 12:45 PM Delcie Roch [facility] MEDICAL ONCOLOGY [phone] None   10/18/2012 1:15 PM Amy Allegra Grana, PA [facility] MEDICAL ONCOLOGY [phone] None     Future Orders Please Complete By Expires   Nursing communication      Scheduling Instructions:   Please change dressing on back with ABDs and hypofix tape. The drains at the back need Bacitracin ointment and 4x4 gauze with tape. Thanks. Please hold discharge until after Lovenox at 2 pm.  Thanks       Medication List     As of 08/14/2012 11:12 AM    STOP taking these medications         HYDROcodone-acetaminophen 5-325 MG per tablet   Commonly known as: NORCO/VICODIN      VALERIAN ROOT PO      vitamin E 400 UNIT capsule      TAKE these medications         cephALEXin 500 MG capsule   Commonly known as: KEFLEX   Take 1 capsule (500 mg total) by mouth every 12 (twelve) hours.      citalopram 20 MG tablet   Commonly known as: CELEXA   Take 10-20 mg by mouth 2 (two) times daily. 20 mg. AM, 10 mg. afternoon      DSS 100 MG Caps   Take 100 mg by mouth 2 (two) times daily.      enoxaparin 40 MG/0.4ML injection   Commonly known as: LOVENOX   Inject 0.4 mLs (40 mg total) into the skin daily.      HYDROmorphone 2 MG tablet   Commonly known as: DILAUDID   Take 1-2 tablets (2-4 mg total) by mouth every 4 (four) hours as needed.      Melatonin 5 MG Caps   Take 5 mg by mouth at bedtime.      methocarbamol 500  MG tablet   Commonly known as: ROBAXIN   Take 1 tablet (500 mg total) by mouth 4 (four) times daily.      omeprazole 20 MG capsule   Commonly known as: PRILOSEC   Take 20 mg by mouth daily.           Follow-up Information    Follow up with Currie Paris, MD. Schedule an appointment as soon as possible for a visit in 3 weeks.   Contact information:   [address] [place] [address] [phone]          Signed: Rossie Muskrat 08/14/2012, 11:12 AM

## 2012-08-15 ENCOUNTER — Other Ambulatory Visit (INDEPENDENT_AMBULATORY_CARE_PROVIDER_SITE_OTHER): Payer: Self-pay | Admitting: General Surgery

## 2012-08-15 ENCOUNTER — Telehealth (INDEPENDENT_AMBULATORY_CARE_PROVIDER_SITE_OTHER): Payer: Self-pay | Admitting: General Surgery

## 2012-08-15 MED ORDER — NYSTATIN 100000 UNIT/ML MT SUSP: 500000.0000 [IU] | Freq: Four times a day (QID) | OROMUCOSAL | Status: DC

## 2012-08-15 NOTE — Telephone Encounter (Signed)
She is on abx s/p mastectomy.  She has whitish plaque in mouth and lack of taste and had thrush after last surgery as well.  I prescribed nystatin swish and spit to her pharmacy and instructed her to use it for 48 hours after symptoms resolve.

## 2012-08-16 ENCOUNTER — Encounter (HOSPITAL_COMMUNITY): Payer: Self-pay | Admitting: Surgery

## 2012-08-16 ENCOUNTER — Telehealth (INDEPENDENT_AMBULATORY_CARE_PROVIDER_SITE_OTHER): Payer: Self-pay | Admitting: Surgery

## 2012-08-16 NOTE — Telephone Encounter (Signed)
Path report noted and called to patient

## 2012-08-19 ENCOUNTER — Encounter: Payer: BC Managed Care – PPO | Admitting: Genetic Counselor

## 2012-08-19 ENCOUNTER — Other Ambulatory Visit: Payer: BC Managed Care – PPO | Admitting: Lab

## 2012-08-31 ENCOUNTER — Encounter (INDEPENDENT_AMBULATORY_CARE_PROVIDER_SITE_OTHER): Payer: Self-pay | Admitting: Surgery

## 2012-08-31 ENCOUNTER — Ambulatory Visit (INDEPENDENT_AMBULATORY_CARE_PROVIDER_SITE_OTHER): Payer: BC Managed Care – PPO | Admitting: Surgery

## 2012-08-31 VITALS — BP 118/82 | HR 68 | Temp 96.3°F | Resp 16 | Ht 62.0 in | Wt 150.0 lb

## 2012-08-31 DIAGNOSIS — Z09 Encounter for follow-up examination after completed treatment for conditions other than malignant neoplasm: Secondary | ICD-10-CM

## 2012-08-31 NOTE — Progress Notes (Signed)
Chief complaint: Postop left mastectomy and sentinel lymph node  History of present illness: This patient underwent surgery about 3 weeks ago for DCIS of the left breast. She had a this flap reconstruction and has been seeing the plastic surgeon for postop care. She came in today for my review. She overall feels like she is doing well. She still has 2 Jackson-Pratt drains in.  Sam: Gen.: Patient alert and healthy-appearing Breasts: Incisions are healing nicely. There is clear fluid drained. No evidence of infection or other problems.  Pathology: A small focus of invasive carcinoma was found in the specimen. It measures 0.22 cm. It was ER positive at 91%, PR +3%, with a low Ki67 and negative HER-2.  Impression: Doing well  Plan: I will see her back in 3 months. I gave her a copy of her pathology report. She'll make a followup appointment with her oncologist.

## 2012-08-31 NOTE — Patient Instructions (Signed)
I would like to see you again in about 3 months for followup

## 2012-09-02 ENCOUNTER — Other Ambulatory Visit: Payer: Self-pay | Admitting: Oncology

## 2012-09-03 ENCOUNTER — Telehealth: Payer: Self-pay | Admitting: Oncology

## 2012-09-03 NOTE — Telephone Encounter (Signed)
Called pt, gave appt 2/5 @ 9am. Pt verbalized understanding.

## 2012-09-22 ENCOUNTER — Telehealth: Payer: Self-pay | Admitting: Oncology

## 2012-09-22 ENCOUNTER — Ambulatory Visit (HOSPITAL_BASED_OUTPATIENT_CLINIC_OR_DEPARTMENT_OTHER): Payer: BC Managed Care – PPO | Admitting: Oncology

## 2012-09-22 VITALS — BP 155/99 | HR 72 | Temp 98.1°F | Resp 20 | Ht 62.0 in | Wt 149.8 lb

## 2012-09-22 DIAGNOSIS — Z853 Personal history of malignant neoplasm of breast: Secondary | ICD-10-CM

## 2012-09-22 DIAGNOSIS — Z171 Estrogen receptor negative status [ER-]: Secondary | ICD-10-CM

## 2012-09-22 DIAGNOSIS — C50912 Malignant neoplasm of unspecified site of left female breast: Secondary | ICD-10-CM

## 2012-09-22 DIAGNOSIS — C50419 Malignant neoplasm of upper-outer quadrant of unspecified female breast: Secondary | ICD-10-CM

## 2012-09-22 NOTE — Progress Notes (Signed)
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 Diagnostic mammogram and possibly ultrasound of the left breast. (Code:FI-L-33M)  BI-RADS CATEGORY 0:  Incomplete.  Need additional imaging evaluation and/or prior mammograms for comparison.   Original Report Authenticated By: Littie Deeds. ARCEO, M.D.     ASSESSMENT: A 57 year old BRCA negative Kathryne Sharper woman   (1)  status post right breast biopsy August of 2011, with definitive right mastectomy and sentinel lymph node sampling October of 2011 and TRAM reconstruction February 2102 for what proved to be a pT2 pN0, stage IIA invasive ductal carcinoma, grade 1, triple negative.  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  Margins were ample.   (2) the patient  decided to forego adjuvant therapy.    (3)  status post left breast biopsy ninth 13 2013 showing ([id number]) atypical ductal hyperplasia with apocrine change.  (4) status post left mastectomy with sentinel lymph node sampling 08/12/2012 for ductal carcinoma in situ, grade 2, and microinvasive ductal carcinoma, grade 1, pT1a pN0, estrogen receptor 91% and progesterone receptor 3% positive, with an MIB-1 of 3% and no HER-2 amplification. The patient again opted against adjuvant antiestrogen  treatment.      PLAN: We spent the better part of her hour-long visit today going over the biology of her tumor and the options for adjuvant treatment. She understands that for noninvasive breast cancer, mastectomy is curative 99% of the time, so she does not need any further treatment for her "in situ" component.  With microinvasive ductal breast cancer the risk of recurrence with local treatment only is very low, generally less than 5% at 5 years. Accordingly NCCN guidelines suggest to "consider endocrine treatment". That is what we did today, and after much discussion they decided she does not want to endocrine therapy but only observation with her followup.  Accordingly she'll see [place] again in October and we will see her on a yearly basis thereafter until she completes 5 years of followup which will be October of 2018. Incidentally she is considering bilateral salpingo-oophorectomy and if she did that she would get as much benefit in my opinion as if she took anti-estrogens. She knows to call for any problems that may develop before the next visit.  MAGRINAT,GUSTAV C    09/22/2012

## 2012-09-22 NOTE — Telephone Encounter (Signed)
gv pt appt schedule for October and confirmed 2/17 genetics appt. Per GM 3/3 lbAB not needed.

## 2012-09-27 ENCOUNTER — Other Ambulatory Visit (HOSPITAL_COMMUNITY): Payer: Self-pay | Admitting: Plastic Surgery

## 2012-09-27 DIAGNOSIS — IMO0002 Reserved for concepts with insufficient information to code with codable children: Secondary | ICD-10-CM

## 2012-09-28 ENCOUNTER — Other Ambulatory Visit: Payer: Self-pay | Admitting: Radiology

## 2012-09-29 ENCOUNTER — Ambulatory Visit (HOSPITAL_COMMUNITY)
Admission: RE | Admit: 2012-09-29 | Discharge: 2012-09-29 | Disposition: A | Discharge status: Home / Self Care | Payer: BC Managed Care – PPO | Source: Ambulatory Visit | Attending: Plastic Surgery | Admitting: Plastic Surgery

## 2012-09-29 DIAGNOSIS — IMO0002 Reserved for concepts with insufficient information to code with codable children: Secondary | ICD-10-CM

## 2012-09-29 DIAGNOSIS — Y849 Medical procedure, unspecified as the cause of abnormal reaction of the patient, or of later complication, without mention of misadventure at the time of the procedure: Secondary | ICD-10-CM | POA: Insufficient documentation

## 2012-09-29 LAB — CBC
Hemoglobin: 13.1 g/dL (ref 12.0–15.0)
MCH: 30.8 pg (ref 26.0–34.0)
MCHC: 33.1 g/dL (ref 30.0–36.0)
Platelets: 376 10*3/uL (ref 150–400)
RBC: 4.25 MIL/uL (ref 3.87–5.11)

## 2012-09-29 LAB — PROTIME-INR
INR: 0.98 (ref 0.00–1.49)
Prothrombin Time: 12.9 seconds (ref 11.6–15.2)

## 2012-09-29 LAB — APTT: aPTT: 25 seconds (ref 24–37)

## 2012-09-29 MED ORDER — SODIUM CHLORIDE 0.9 % IV SOLN: INTRAVENOUS | Status: DC

## 2012-09-29 MED ORDER — FENTANYL CITRATE 0.05 MG/ML IJ SOLN
INTRAMUSCULAR | Status: AC
  Filled 2012-09-29: qty 4

## 2012-09-29 MED ORDER — MIDAZOLAM HCL 2 MG/2ML IJ SOLN
INTRAMUSCULAR | Status: AC
  Filled 2012-09-29: qty 4

## 2012-09-29 NOTE — ED Notes (Signed)
Did asp at bs without sedation

## 2012-09-29 NOTE — Procedures (Signed)
L back seroma aspiration 45 cc serosanguinous fluid No comp
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
## 2012-10-02 ENCOUNTER — Other Ambulatory Visit: Payer: Self-pay

## 2012-10-04 ENCOUNTER — Ambulatory Visit (HOSPITAL_BASED_OUTPATIENT_CLINIC_OR_DEPARTMENT_OTHER): Payer: BC Managed Care – PPO | Admitting: Genetic Counselor

## 2012-10-04 ENCOUNTER — Encounter: Payer: Self-pay | Admitting: Genetic Counselor

## 2012-10-04 ENCOUNTER — Other Ambulatory Visit: Payer: BC Managed Care – PPO | Admitting: Lab

## 2012-10-04 DIAGNOSIS — C50419 Malignant neoplasm of upper-outer quadrant of unspecified female breast: Secondary | ICD-10-CM

## 2012-10-04 DIAGNOSIS — IMO0002 Reserved for concepts with insufficient information to code with codable children: Secondary | ICD-10-CM

## 2012-10-04 DIAGNOSIS — Z801 Family history of malignant neoplasm of trachea, bronchus and lung: Secondary | ICD-10-CM

## 2012-10-04 DIAGNOSIS — C50912 Malignant neoplasm of unspecified site of left female breast: Secondary | ICD-10-CM
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
## 2012-10-18 ENCOUNTER — Ambulatory Visit: Payer: BC Managed Care – PPO | Admitting: Physician Assistant

## 2012-10-18 ENCOUNTER — Other Ambulatory Visit: Payer: BC Managed Care – PPO | Admitting: Lab

## 2012-11-30 ENCOUNTER — Ambulatory Visit (INDEPENDENT_AMBULATORY_CARE_PROVIDER_SITE_OTHER): Payer: BC Managed Care – PPO | Admitting: Surgery

## 2012-11-30 ENCOUNTER — Encounter (INDEPENDENT_AMBULATORY_CARE_PROVIDER_SITE_OTHER): Payer: Self-pay | Admitting: Surgery

## 2012-11-30 VITALS — BP 118/82 | HR 79 | Temp 99.9°F | Resp 18 | Ht 62.0 in | Wt 156.6 lb

## 2012-11-30 DIAGNOSIS — C50912 Malignant neoplasm of unspecified site of left female breast: Secondary | ICD-10-CM

## 2012-11-30 DIAGNOSIS — C50919 Malignant neoplasm of unspecified site of unspecified female breast: Secondary | ICD-10-CM

## 2012-11-30 NOTE — Progress Notes (Signed)
NAME: Ariel Mccall       DOB: Oct 17, 1955           DATE: 11/30/2012       MRN: [id number]  CC:   Chief Complaint  Patient presents with  . Breast Cancer Long Term Follow Up    LTF 66mo reck br    Ariel Mccall is a 57 y.o.Marland Kitchenfemale who presents for routine followup of her Bilateral breast cancers diagnosed in 2011 and 2013 and treated with bilateral mastectomy, right TRAM and left latissimus reconstruction. She has no problems or concerns on either side.She will be having final work on the left done in a month or so  PFSH: She has had no significant changes since the last visit here.  ROS: There have been no significant changes since the last visit here  EXAM:  VS: BP 118/82  Pulse 79  Temp(Src) 99.9 F (37.7 C) (Temporal)  Resp 18  Ht 5\' 2"  (1.575 m)  Wt 156 lb 9.6 oz (71.033 kg)  BMI 28.64 kg/m2  General: The patient is alert, oriented, generally healthy appearing, NAD. Mood and affect are normal.  Breasts:  S/P bilateral mastectomy reconstruction, no evidence of recurrence  Lymphatics: She has no axillary or supraclavicular adenopathy on either side.  Extremities: Full ROM of the surgical side with no lymphedema noted.  Data Reviewed: Nn new  Impression: Doing well, with no evidence of recurrent cancer or new cancer  Plan: Will see in six months after a mammogram

## 2012-11-30 NOTE — Patient Instructions (Signed)
See me in 6 months after a mammogram

## 2012-12-27 ENCOUNTER — Encounter: Payer: Self-pay | Admitting: Certified Nurse Midwife

## 2012-12-28 ENCOUNTER — Encounter: Payer: Self-pay | Admitting: Certified Nurse Midwife

## 2012-12-28 ENCOUNTER — Ambulatory Visit (INDEPENDENT_AMBULATORY_CARE_PROVIDER_SITE_OTHER): Payer: BC Managed Care – PPO | Admitting: Certified Nurse Midwife

## 2012-12-28 VITALS — BP 98/60 | Ht 62.75 in | Wt 156.0 lb

## 2012-12-28 DIAGNOSIS — Z0000 Encounter for general adult medical examination without abnormal findings: Secondary | ICD-10-CM

## 2012-12-28 DIAGNOSIS — Z01419 Encounter for gynecological examination (general) (routine) without abnormal findings: Secondary | ICD-10-CM

## 2012-12-28 LAB — POCT URINALYSIS DIPSTICK
Glucose, UA: NEGATIVE
Leukocytes, UA: NEGATIVE
Nitrite, UA: NEGATIVE
Urobilinogen, UA: NEGATIVE
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
## 2012-12-28 NOTE — Patient Instructions (Signed)

## 2012-12-30 LAB — IPS PAP TEST WITH REFLEX TO HPV

## 2013-01-14 ENCOUNTER — Telehealth: Payer: Self-pay | Admitting: Obstetrics & Gynecology

## 2013-01-14 NOTE — Telephone Encounter (Signed)
LMTCB to discuss insurance benefits for surgery.  °

## 2013-01-25 ENCOUNTER — Telehealth: Payer: Self-pay | Admitting: *Deleted

## 2013-01-25 NOTE — Telephone Encounter (Signed)
Spoke to patient regarding dates available at hospital for combined case with dr Odis Luster office.  Patient's preferred date of 03-10-13 is not available at hospital.  Scheduled case for Tuesday August 5,2014 at 0730 at [facility] and Dr Odis Luster to follow Dr Hyacinth Meeker.  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 FMLA forms can be completed if needed.  Patient thinks this date will work her, she will confirm with employer and will call me back to confirm and schedule pre/post op appts.

## 2013-01-25 NOTE — Telephone Encounter (Signed)
Patient returning Sally's call. °

## 2013-01-25 NOTE — Telephone Encounter (Signed)
Call to patient to review surgical instructions and dates. LMTCB

## 2013-01-26 NOTE — Telephone Encounter (Signed)
Patient states that she would rather have two separate surgeries than wait till August and miss returning to work on time.  She can do any other date in July except the week of 02-18-13.  Advised I will check with hospital in the am regarding availability and call her back.

## 2013-01-26 NOTE — Telephone Encounter (Signed)
Returning call.

## 2013-01-28 NOTE — Telephone Encounter (Signed)
After cehcking available dates with hospital and dr Odis Luster office, still unable to get a time for combined case.  Dr Odis Luster office called patient and me to say they couldn't work it out but they would acomodate her schedule in the fall.  Surgery is scheduled for 03-07-13, since this was a possible option with dr Odis Luster.  Patient is notified of this and since school would restart 4 weeks after surgery and no longer considering dr Odis Luster schedule, she will consider this over weekend and call me Monday.  She might prefer to try to have surgery even earlier, July 7 is an option as second case.  Patient will call me on Monday with her decision.

## 2013-02-01 ENCOUNTER — Telehealth: Payer: Self-pay | Admitting: *Deleted

## 2013-02-01 NOTE — Telephone Encounter (Signed)
Left message with husband since patient not available that I took care of request and can call back.

## 2013-02-03 NOTE — Telephone Encounter (Signed)
Patient also wanted dr Hyacinth Meeker to know that she had skin reaction of "red bumps' after Dermabond use and hospital has it listed as an allergy.

## 2013-02-03 NOTE — Telephone Encounter (Signed)
Call from patient who wants to confirm surgical information.  Robotic TLH/BSO scheduled for 02-21-13 at 1145 at [facility].  Surgical instructions given and patient copy mailed.  Pre/post op appts scheduled.  She will be out of town next week so she will call [facility] pre op dept to schedule appts. Phone number given. Call prn

## 2013-02-08 NOTE — Telephone Encounter (Signed)
Patient returning Sally's call. °

## 2013-02-08 NOTE — Telephone Encounter (Signed)
Patient left message with insurance dept that she was canceling surgery due to our policy to collect up front OOP cost.  Call to patient today to notify her that we are looking into the "Care Credit" plan she asked about bit that if this cant be worked out, administration will allow payment plan.  LMTCB.

## 2013-02-11 NOTE — Telephone Encounter (Signed)
Call to patient to notify her that we have made arrangements to allow payment plan for her.  Want her to be able to have her surgery if she wants and not cancel for financial reasons. LMTCB to let me know if interested.

## 2013-02-14 ENCOUNTER — Ambulatory Visit (INDEPENDENT_AMBULATORY_CARE_PROVIDER_SITE_OTHER): Payer: BC Managed Care – PPO | Admitting: Obstetrics & Gynecology

## 2013-02-14 ENCOUNTER — Encounter (HOSPITAL_COMMUNITY): Payer: Self-pay

## 2013-02-14 ENCOUNTER — Encounter: Payer: Self-pay | Admitting: Obstetrics & Gynecology

## 2013-02-14 VITALS — BP 132/80 | HR 80 | Resp 12 | Wt 163.0 lb

## 2013-02-14 DIAGNOSIS — N939 Abnormal uterine and vaginal bleeding, unspecified: Secondary | ICD-10-CM

## 2013-02-14 DIAGNOSIS — N926 Irregular menstruation, unspecified: Secondary | ICD-10-CM

## 2013-02-14 MED ORDER — OXYCODONE-ACETAMINOPHEN 5-325 MG PO TABS: 1.0000 | ORAL_TABLET | ORAL | Status: DC | PRN

## 2013-02-14 MED ORDER — IBUPROFEN 800 MG PO TABS: 800.0000 mg | ORAL_TABLET | Freq: Three times a day (TID) | ORAL | Status: AC | PRN
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
## 2013-02-14 NOTE — Patient Instructions (Addendum)
Refer to brochure regarding post-op do's and don'ts

## 2013-02-15 ENCOUNTER — Encounter (HOSPITAL_COMMUNITY)
Admission: RE | Admit: 2013-02-15 | Discharge: 2013-02-15 | Disposition: A | Discharge status: Home / Self Care | Payer: BC Managed Care – PPO | Source: Ambulatory Visit | Attending: Obstetrics & Gynecology | Admitting: Obstetrics & Gynecology

## 2013-02-15 ENCOUNTER — Other Ambulatory Visit: Payer: Self-pay | Admitting: Obstetrics & Gynecology

## 2013-02-15 ENCOUNTER — Encounter (HOSPITAL_COMMUNITY): Payer: Self-pay

## 2013-02-15 ENCOUNTER — Ambulatory Visit: Payer: BC Managed Care – PPO

## 2013-02-15 DIAGNOSIS — Z01812 Encounter for preprocedural laboratory examination: Secondary | ICD-10-CM | POA: Insufficient documentation

## 2013-02-15 DIAGNOSIS — Z01818 Encounter for other preprocedural examination: Secondary | ICD-10-CM | POA: Insufficient documentation

## 2013-02-15 DIAGNOSIS — Z1501 Genetic susceptibility to malignant neoplasm of breast: Secondary | ICD-10-CM

## 2013-02-15 LAB — CBC
Platelets: 340 10*3/uL (ref 150–400)
RDW: 14.2 % (ref 11.5–15.5)
WBC: 6.7 10*3/uL (ref 4.0–10.5)

## 2013-02-15 NOTE — Telephone Encounter (Signed)
Spoke with patient at preop appt, since we can make payment arrangements, she does want to proceed.

## 2013-02-15 NOTE — Patient Instructions (Addendum)
Your procedure is scheduled on:02/21/13  Enter through the Main Entrance at : 10:15 am Pick up desk phone and dial [phone] and inform us of your arrival.  Please call [phone] if you have any problems the morning of surgery.  Remember: Do not eat food after midnight:Sunday Clear liquids are ok until:0730 am Monday   You may brush your teeth the morning of surgery.  Take these meds the morning of surgery with a sip of water:none  DO NOT wear jewelry, eye make-up, lipstick,body lotion, or dark fingernail polish.  (Polished toes are ok) You may wear deodorant.  If you are to be admitted after surgery, leave suitcase in car until your room has been assigned. Patients discharged on the day of surgery will not be allowed to drive home. Wear loose fitting, comfortable clothes for your ride home.

## 2013-02-16 ENCOUNTER — Ambulatory Visit (INDEPENDENT_AMBULATORY_CARE_PROVIDER_SITE_OTHER): Payer: BC Managed Care – PPO | Admitting: Obstetrics & Gynecology

## 2013-02-16 ENCOUNTER — Telehealth: Payer: Self-pay | Admitting: *Deleted

## 2013-02-16 ENCOUNTER — Other Ambulatory Visit: Payer: Self-pay | Admitting: *Deleted

## 2013-02-16 ENCOUNTER — Other Ambulatory Visit: Payer: BC Managed Care – PPO

## 2013-02-16 ENCOUNTER — Other Ambulatory Visit (INDEPENDENT_AMBULATORY_CARE_PROVIDER_SITE_OTHER): Payer: BC Managed Care – PPO

## 2013-02-16 DIAGNOSIS — N926 Irregular menstruation, unspecified: Secondary | ICD-10-CM

## 2013-02-16 DIAGNOSIS — Z853 Personal history of malignant neoplasm of breast: Secondary | ICD-10-CM

## 2013-02-16 NOTE — Telephone Encounter (Signed)
Copy of BRCA test results received from [facility] and sent to your desk along with chart for surgery on 02-21-13.  Patient had PUS today.  She is on a low dose of Citalopram and is concerned about potential post-surgical depressive issues.  Any suggestions?

## 2013-02-16 NOTE — Progress Notes (Signed)
      Images reviewed.  Ultrasound done pre-operatively.  Patient did not see me day of ultrasound as it was normal.    MSM

## 2013-02-17 NOTE — Telephone Encounter (Signed)
Patient calling with surgery questions for Kennon Rounds.

## 2013-02-17 NOTE — Telephone Encounter (Signed)
Attempt to call patient back before holiday weekend.  LMTCB on Monday before going to hosp if desires.

## 2013-02-17 NOTE — Telephone Encounter (Signed)
Return call to patient, scheduled for surgery 02-21-13.  LMTCB.

## 2013-02-20 ENCOUNTER — Encounter: Payer: Self-pay | Admitting: Obstetrics & Gynecology

## 2013-02-20 NOTE — Patient Instructions (Signed)
Follow all pre-operative instructions.

## 2013-02-21 ENCOUNTER — Encounter (HOSPITAL_COMMUNITY)
Admission: RE | Disposition: A | Discharge status: Home / Self Care | Payer: Self-pay | Source: Ambulatory Visit | Attending: Obstetrics & Gynecology

## 2013-02-21 ENCOUNTER — Ambulatory Visit (HOSPITAL_COMMUNITY): Payer: BC Managed Care – PPO | Admitting: Anesthesiology

## 2013-02-21 ENCOUNTER — Encounter (HOSPITAL_COMMUNITY): Payer: Self-pay | Admitting: Anesthesiology

## 2013-02-21 ENCOUNTER — Ambulatory Visit (HOSPITAL_COMMUNITY)
Admission: RE | Admit: 2013-02-21 | Discharge: 2013-02-22 | Disposition: A | Discharge status: Home / Self Care | Payer: BC Managed Care – PPO | Source: Ambulatory Visit | Attending: Obstetrics & Gynecology | Admitting: Obstetrics & Gynecology

## 2013-02-21 DIAGNOSIS — N938 Other specified abnormal uterine and vaginal bleeding: Secondary | ICD-10-CM | POA: Insufficient documentation

## 2013-02-21 DIAGNOSIS — Z853 Personal history of malignant neoplasm of breast: Secondary | ICD-10-CM | POA: Insufficient documentation

## 2013-02-21 DIAGNOSIS — N949 Unspecified condition associated with female genital organs and menstrual cycle: Secondary | ICD-10-CM | POA: Insufficient documentation

## 2013-02-21 DIAGNOSIS — N838 Other noninflammatory disorders of ovary, fallopian tube and broad ligament: Secondary | ICD-10-CM | POA: Insufficient documentation

## 2013-02-21 DIAGNOSIS — N72 Inflammatory disease of cervix uteri: Secondary | ICD-10-CM | POA: Insufficient documentation

## 2013-02-21 DIAGNOSIS — N939 Abnormal uterine and vaginal bleeding, unspecified: Secondary | ICD-10-CM

## 2013-02-21 DIAGNOSIS — N926 Irregular menstruation, unspecified: Secondary | ICD-10-CM

## 2013-02-21 DIAGNOSIS — N841 Polyp of cervix uteri: Secondary | ICD-10-CM | POA: Insufficient documentation

## 2013-02-21 HISTORY — PX: CYSTOSCOPY: SHX5120

## 2013-02-21 HISTORY — PX: ROBOTIC ASSISTED TOTAL HYSTERECTOMY: SHX6085

## 2013-02-21 SURGERY — ROBOTIC ASSISTED TOTAL HYSTERECTOMY
Anesthesia: General | Site: Abdomen | Wound class: Clean Contaminated

## 2013-02-21 MED ORDER — LIDOCAINE HCL (CARDIAC) 20 MG/ML IV SOLN
INTRAVENOUS | Status: AC
  Filled 2013-02-21: qty 5

## 2013-02-21 MED ORDER — DEXAMETHASONE SODIUM PHOSPHATE 10 MG/ML IJ SOLN
INTRAMUSCULAR | Status: DC | PRN
  Administered 2013-02-21: 10 mg via INTRAVENOUS

## 2013-02-21 MED ORDER — EPHEDRINE 5 MG/ML INJ
INTRAVENOUS | Status: AC
  Filled 2013-02-21: qty 10

## 2013-02-21 MED ORDER — PHENOL 1.4 % MT LIQD
1.0000 | OROMUCOSAL | Status: DC | PRN
  Administered 2013-02-21: 1 via OROMUCOSAL
  Filled 2013-02-21: qty 177

## 2013-02-21 MED ORDER — MENTHOL 3 MG MT LOZG
1.0000 | LOZENGE | OROMUCOSAL | Status: DC | PRN
  Administered 2013-02-21: 3 mg via ORAL
  Filled 2013-02-21: qty 9

## 2013-02-21 MED ORDER — TEMAZEPAM 15 MG PO CAPS: 15.0000 mg | ORAL_CAPSULE | Freq: Every evening | ORAL | Status: DC | PRN

## 2013-02-21 MED ORDER — STERILE WATER FOR IRRIGATION IR SOLN
Status: DC | PRN
  Administered 2013-02-21: 1000 mL

## 2013-02-21 MED ORDER — INDIGOTINDISULFONATE SODIUM 8 MG/ML IJ SOLN
INTRAMUSCULAR | Status: AC
  Filled 2013-02-21: qty 5

## 2013-02-21 MED ORDER — KETOROLAC TROMETHAMINE 30 MG/ML IJ SOLN
INTRAMUSCULAR | Status: DC | PRN
  Administered 2013-02-21: 30 mg via INTRAVENOUS

## 2013-02-21 MED ORDER — FENTANYL CITRATE 0.05 MG/ML IJ SOLN
INTRAMUSCULAR | Status: AC
  Filled 2013-02-21: qty 2

## 2013-02-21 MED ORDER — SUFENTANIL CITRATE 50 MCG/ML IV SOLN
INTRAVENOUS | Status: DC | PRN
  Administered 2013-02-21: 10 ug via INTRAVENOUS
  Administered 2013-02-21: 15 ug via INTRAVENOUS
  Administered 2013-02-21: 10 ug via INTRAVENOUS
  Administered 2013-02-21: 15 ug via INTRAVENOUS

## 2013-02-21 MED ORDER — MIDAZOLAM HCL 2 MG/2ML IJ SOLN
INTRAMUSCULAR | Status: AC
  Filled 2013-02-21: qty 2

## 2013-02-21 MED ORDER — KETOROLAC TROMETHAMINE 30 MG/ML IJ SOLN
30.0000 mg | Freq: Four times a day (QID) | INTRAMUSCULAR | Status: DC
  Administered 2013-02-21 – 2013-02-22 (×3): 30 mg via INTRAVENOUS
  Filled 2013-02-21 (×3): qty 1

## 2013-02-21 MED ORDER — ONDANSETRON HCL 4 MG/2ML IJ SOLN
INTRAMUSCULAR | Status: AC
  Filled 2013-02-21: qty 2

## 2013-02-21 MED ORDER — PROPOFOL 10 MG/ML IV BOLUS
INTRAVENOUS | Status: DC | PRN
  Administered 2013-02-21: 50 mg via INTRAVENOUS
  Administered 2013-02-21: 150 mg via INTRAVENOUS

## 2013-02-21 MED ORDER — SUFENTANIL CITRATE 50 MCG/ML IV SOLN
INTRAVENOUS | Status: AC
  Filled 2013-02-21: qty 1

## 2013-02-21 MED ORDER — MORPHINE SULFATE 4 MG/ML IJ SOLN
1.0000 mg | INTRAMUSCULAR | Status: DC | PRN
  Administered 2013-02-21: 2 mg via INTRAVENOUS
  Filled 2013-02-21: qty 1

## 2013-02-21 MED ORDER — LIDOCAINE HCL (CARDIAC) 20 MG/ML IV SOLN
INTRAVENOUS | Status: DC | PRN
  Administered 2013-02-21: 60 mg via INTRAVENOUS

## 2013-02-21 MED ORDER — PROPOFOL 10 MG/ML IV EMUL
INTRAVENOUS | Status: AC
  Filled 2013-02-21: qty 40

## 2013-02-21 MED ORDER — LACTATED RINGERS IR SOLN
Status: DC | PRN
  Administered 2013-02-21: 3000 mL

## 2013-02-21 MED ORDER — ROCURONIUM BROMIDE 100 MG/10ML IV SOLN
INTRAVENOUS | Status: DC | PRN
  Administered 2013-02-21: 10 mg via INTRAVENOUS
  Administered 2013-02-21: 20 mg via INTRAVENOUS
  Administered 2013-02-21: 50 mg via INTRAVENOUS

## 2013-02-21 MED ORDER — ARTIFICIAL TEARS OP OINT
TOPICAL_OINTMENT | OPHTHALMIC | Status: DC | PRN
  Administered 2013-02-21: 1 via OPHTHALMIC

## 2013-02-21 MED ORDER — KETOROLAC TROMETHAMINE 30 MG/ML IJ SOLN
INTRAMUSCULAR | Status: AC
  Filled 2013-02-21: qty 1

## 2013-02-21 MED ORDER — SIMETHICONE 80 MG PO CHEW: 80.0000 mg | CHEWABLE_TABLET | Freq: Four times a day (QID) | ORAL | Status: DC | PRN

## 2013-02-21 MED ORDER — ROPIVACAINE HCL 5 MG/ML IJ SOLN
INTRAMUSCULAR | Status: AC
  Filled 2013-02-21: qty 60

## 2013-02-21 MED ORDER — PANTOPRAZOLE SODIUM 40 MG PO TBEC
40.0000 mg | DELAYED_RELEASE_TABLET | Freq: Every day | ORAL | Status: DC
  Administered 2013-02-22: 40 mg via ORAL
  Filled 2013-02-21 (×3): qty 1

## 2013-02-21 MED ORDER — DEXAMETHASONE SODIUM PHOSPHATE 10 MG/ML IJ SOLN
INTRAMUSCULAR | Status: AC
  Filled 2013-02-21: qty 1

## 2013-02-21 MED ORDER — NEOSTIGMINE METHYLSULFATE 1 MG/ML IJ SOLN
INTRAMUSCULAR | Status: DC | PRN
  Administered 2013-02-21: 3 mg via INTRAVENOUS

## 2013-02-21 MED ORDER — MIDAZOLAM HCL 5 MG/5ML IJ SOLN
INTRAMUSCULAR | Status: DC | PRN
  Administered 2013-02-21: 2 mg via INTRAVENOUS

## 2013-02-21 MED ORDER — GLYCOPYRROLATE 0.2 MG/ML IJ SOLN
INTRAMUSCULAR | Status: DC | PRN
  Administered 2013-02-21: 0.6 mg via INTRAVENOUS

## 2013-02-21 MED ORDER — EPHEDRINE SULFATE 50 MG/ML IJ SOLN
INTRAMUSCULAR | Status: DC | PRN
  Administered 2013-02-21 (×2): 10 mg via INTRAVENOUS

## 2013-02-21 MED ORDER — FENTANYL CITRATE 0.05 MG/ML IJ SOLN
25.0000 ug | INTRAMUSCULAR | Status: DC | PRN
  Administered 2013-02-21: 50 ug via INTRAVENOUS

## 2013-02-21 MED ORDER — PROMETHAZINE HCL 25 MG/ML IJ SOLN: 6.2500 mg | INTRAMUSCULAR | Status: DC | PRN

## 2013-02-21 MED ORDER — ACETAMINOPHEN 325 MG PO TABS: 650.0000 mg | ORAL_TABLET | ORAL | Status: DC | PRN

## 2013-02-21 MED ORDER — ALUM & MAG HYDROXIDE-SIMETH 200-200-20 MG/5ML PO SUSP: 30.0000 mL | ORAL | Status: DC | PRN

## 2013-02-21 MED ORDER — SUMATRIPTAN SUCCINATE 6 MG/0.5ML ~~LOC~~ SOLN
6.0000 mg | SUBCUTANEOUS | Status: DC | PRN
  Administered 2013-02-21: 6 mg via SUBCUTANEOUS
  Filled 2013-02-21: qty 0.5

## 2013-02-21 MED ORDER — DULOXETINE HCL 60 MG PO CPEP
60.0000 mg | ORAL_CAPSULE | Freq: Every day | ORAL | Status: DC
  Administered 2013-02-22: 60 mg via ORAL
  Filled 2013-02-21 (×3): qty 1

## 2013-02-21 MED ORDER — GLYCOPYRROLATE 0.2 MG/ML IJ SOLN
INTRAMUSCULAR | Status: AC
  Filled 2013-02-21: qty 2

## 2013-02-21 MED ORDER — MIDAZOLAM HCL 2 MG/2ML IJ SOLN: 0.5000 mg | Freq: Once | INTRAMUSCULAR | Status: DC | PRN

## 2013-02-21 MED ORDER — MEPERIDINE HCL 25 MG/ML IJ SOLN: 6.2500 mg | INTRAMUSCULAR | Status: DC | PRN

## 2013-02-21 MED ORDER — LACTATED RINGERS IV SOLN
INTRAVENOUS | Status: DC
  Administered 2013-02-21 (×3): via INTRAVENOUS

## 2013-02-21 MED ORDER — OXYCODONE-ACETAMINOPHEN 5-325 MG PO TABS
1.0000 | ORAL_TABLET | ORAL | Status: DC | PRN
  Administered 2013-02-22: 1 via ORAL
  Filled 2013-02-21 (×2): qty 1

## 2013-02-21 MED ORDER — NEOSTIGMINE METHYLSULFATE 1 MG/ML IJ SOLN
INTRAMUSCULAR | Status: AC
  Filled 2013-02-21: qty 1

## 2013-02-21 MED ORDER — ROPIVACAINE HCL 5 MG/ML IJ SOLN
INTRAMUSCULAR | Status: DC | PRN
  Administered 2013-02-21: 75 mL

## 2013-02-21 MED ORDER — DEXTROSE-NACL 5-0.45 % IV SOLN
INTRAVENOUS | Status: DC
  Administered 2013-02-21: 16:00:00 via INTRAVENOUS

## 2013-02-21 MED ORDER — CEFAZOLIN SODIUM-DEXTROSE 2-3 GM-% IV SOLR
2.0000 g | INTRAVENOUS | Status: AC
  Administered 2013-02-21: 2 g via INTRAVENOUS

## 2013-02-21 MED ORDER — KETOROLAC TROMETHAMINE 30 MG/ML IJ SOLN: 30.0000 mg | Freq: Four times a day (QID) | INTRAMUSCULAR | Status: DC

## 2013-02-21 MED ORDER — ROCURONIUM BROMIDE 50 MG/5ML IV SOLN
INTRAVENOUS | Status: AC
  Filled 2013-02-21: qty 1

## 2013-02-21 MED ORDER — INDIGOTINDISULFONATE SODIUM 8 MG/ML IJ SOLN
INTRAMUSCULAR | Status: DC | PRN
  Administered 2013-02-21: 5 mL via INTRAVENOUS

## 2013-02-21 MED ORDER — ONDANSETRON HCL 4 MG/2ML IJ SOLN
INTRAMUSCULAR | Status: DC | PRN
  Administered 2013-02-21: 4 mg via INTRAVENOUS

## 2013-02-21 MED ORDER — KETOROLAC TROMETHAMINE 30 MG/ML IJ SOLN: 15.0000 mg | Freq: Once | INTRAMUSCULAR | Status: DC | PRN

## 2013-02-21 MED ORDER — CEFAZOLIN SODIUM-DEXTROSE 2-3 GM-% IV SOLR
INTRAVENOUS | Status: AC
  Filled 2013-02-21: qty 50

## 2013-02-21 SURGICAL SUPPLY — 61 items
ADH SKN CLS APL DERMABOND .7 (GAUZE/BANDAGES/DRESSINGS) ×2
APL SKNCLS STERI-STRIP NONHPOA (GAUZE/BANDAGES/DRESSINGS) ×2
BARRIER ADHS 3X4 INTERCEED (GAUZE/BANDAGES/DRESSINGS) ×4 IMPLANT
BENZOIN TINCTURE PRP APPL 2/3 (GAUZE/BANDAGES/DRESSINGS) ×3 IMPLANT
BRR ADH 4X3 ABS CNTRL BYND (GAUZE/BANDAGES/DRESSINGS) ×4
CATH FOLEY 2WAY SLVR  5CC 16FR (CATHETERS) ×1
CATH FOLEY 2WAY SLVR 5CC 16FR (CATHETERS) IMPLANT
CHLORAPREP W/TINT 26ML (MISCELLANEOUS) ×3 IMPLANT
CLOTH BEACON ORANGE TIMEOUT ST (SAFETY) ×3 IMPLANT
CONT PATH 16OZ SNAP LID 3702 (MISCELLANEOUS) ×3 IMPLANT
COVER MAYO STAND STRL (DRAPES) ×3 IMPLANT
COVER TABLE BACK 60X90 (DRAPES) ×6 IMPLANT
COVER TIP SHEARS 8 DVNC (MISCELLANEOUS) ×2 IMPLANT
COVER TIP SHEARS 8MM DA VINCI (MISCELLANEOUS) ×1
DECANTER SPIKE VIAL GLASS SM (MISCELLANEOUS) ×3 IMPLANT
DERMABOND ADVANCED (GAUZE/BANDAGES/DRESSINGS) ×1
DERMABOND ADVANCED .7 DNX12 (GAUZE/BANDAGES/DRESSINGS) ×2 IMPLANT
DRAPE HUG U DISPOSABLE (DRAPE) ×3 IMPLANT
DRAPE LG THREE QUARTER DISP (DRAPES) ×6 IMPLANT
DRAPE WARM FLUID 44X44 (DRAPE) ×3 IMPLANT
ELECT REM PT RETURN 9FT ADLT (ELECTROSURGICAL) ×3
ELECTRODE REM PT RTRN 9FT ADLT (ELECTROSURGICAL) ×2 IMPLANT
EVACUATOR SMOKE 8.L (FILTER) ×3 IMPLANT
GAUZE VASELINE 3X9 (GAUZE/BANDAGES/DRESSINGS) IMPLANT
GLOVE BIOGEL PI IND STRL 7.0 (GLOVE) ×4 IMPLANT
GLOVE BIOGEL PI INDICATOR 7.0 (GLOVE) ×2
GLOVE ECLIPSE 6.5 STRL STRAW (GLOVE) ×12 IMPLANT
GOWN STRL REIN XL XLG (GOWN DISPOSABLE) ×18 IMPLANT
KIT ACCESSORY DA VINCI DISP (KITS) ×1
KIT ACCESSORY DVNC DISP (KITS) ×2 IMPLANT
LEGGING LITHOTOMY PAIR STRL (DRAPES) ×3 IMPLANT
NEEDLE INSUFFLATION 120MM (ENDOMECHANICALS) ×3 IMPLANT
OCCLUDER COLPOPNEUMO (BALLOONS) ×1 IMPLANT
PACK LAVH (CUSTOM PROCEDURE TRAY) ×3 IMPLANT
PAD PREP 24X48 CUFFED NSTRL (MISCELLANEOUS) ×6 IMPLANT
PROTECTOR NERVE ULNAR (MISCELLANEOUS) ×6 IMPLANT
SET CYSTO W/LG BORE CLAMP LF (SET/KITS/TRAYS/PACK) ×3 IMPLANT
SET IRRIG TUBING LAPAROSCOPIC (IRRIGATION / IRRIGATOR) ×3 IMPLANT
SOLUTION ELECTROLUBE (MISCELLANEOUS) ×3 IMPLANT
STRIP CLOSURE SKIN 1/4X4 (GAUZE/BANDAGES/DRESSINGS) ×3 IMPLANT
SUT VIC AB 0 CT1 27 (SUTURE) ×15
SUT VIC AB 0 CT1 27XBRD ANBCTR (SUTURE) ×10 IMPLANT
SUT VICRYL 0 UR6 27IN ABS (SUTURE) ×3 IMPLANT
SUT VICRYL RAPIDE 4/0 PS 2 (SUTURE) ×6 IMPLANT
SUT VLOC 180 0 9IN  GS21 (SUTURE) ×1
SUT VLOC 180 0 9IN GS21 (SUTURE) IMPLANT
SYR 50ML LL SCALE MARK (SYRINGE) ×3 IMPLANT
TIP RUMI ORANGE 6.7MMX12CM (TIP) IMPLANT
TIP UTERINE 5.1X6CM LAV DISP (MISCELLANEOUS) ×1 IMPLANT
TIP UTERINE 6.7X10CM GRN DISP (MISCELLANEOUS) IMPLANT
TIP UTERINE 6.7X6CM WHT DISP (MISCELLANEOUS) IMPLANT
TIP UTERINE 6.7X8CM BLUE DISP (MISCELLANEOUS) IMPLANT
TOWEL OR 17X24 6PK STRL BLUE (TOWEL DISPOSABLE) ×6 IMPLANT
TRAY FOLEY BAG SILVER LF 14FR (CATHETERS) ×3 IMPLANT
TROCAR DILATING TIP 12MM 150MM (ENDOMECHANICALS) ×3 IMPLANT
TROCAR DISP BLADELESS 8 DVNC (TROCAR) ×2 IMPLANT
TROCAR DISP BLADELESS 8MM (TROCAR) ×1
TROCAR XCEL NON-BLD 5MMX100MML (ENDOMECHANICALS) ×3 IMPLANT
TUBING FILTER THERMOFLATOR (ELECTROSURGICAL) ×3 IMPLANT
WARMER LAPAROSCOPE (MISCELLANEOUS) ×3 IMPLANT
WATER STERILE IRR 1000ML POUR (IV SOLUTION) ×9 IMPLANT
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
## 2013-02-21 NOTE — Transfer of Care (Signed)
Immediate Anesthesia Transfer of Care Note  Patient: Ariel Mccall  Procedure(s) Performed: Procedure(s): ROBOTIC ASSISTED TOTAL HYSTERECTOMY WITH BILATERAL SALPINGO-OOPHORECTOMY (Bilateral) CYSTOSCOPY (N/A)  Patient Location: PACU  Anesthesia Type:General  Level of Consciousness: sedated  Airway & Oxygen Therapy: Patient Spontanous Breathing and Patient connected to nasal cannula oxygen  Post-op Assessment: Report given to PACU RN and Post -op Vital signs reviewed and stable  Post vital signs: stable  Complications: No apparent anesthesia complications

## 2013-02-21 NOTE — Anesthesia Postprocedure Evaluation (Signed)
  Anesthesia Post-op Note   Anesthesia Post Note  Patient: Ariel Mccall  Procedure(s) Performed: Procedure(s) (LRB): ROBOTIC ASSISTED TOTAL HYSTERECTOMY WITH BILATERAL SALPINGO-OOPHORECTOMY (Bilateral) CYSTOSCOPY (N/A)  Anesthesia type: General  Patient location: PACU  Post pain: Pain level controlled  Post assessment: Post-op Vital signs reviewed  Last Vitals:  Filed Vitals:   02/21/13 1430  BP: 119/73  Pulse: 89  Temp:   Resp: 20    Post vital signs: Reviewed  Level of consciousness: sedated  Complications: No apparent anesthesia complications
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
## 2013-02-21 NOTE — Op Note (Signed)
02/21/2013  1:55 PM  PATIENT:  Ariel Mccall  57 y.o. female  PRE-OPERATIVE DIAGNOSIS:  DUB CPT S2900 [id number], h/o bilateral breast cancers desirous of ovary removal  POST-OPERATIVE DIAGNOSIS:  ABNORMAL UTERINE BLEEDING,   PROCEDURE:  Procedure(s): ROBOTIC ASSISTED TOTAL HYSTERECTOMY WITH BILATERAL SALPINGO-OOPHORECTOMY CYSTOSCOPY  SURGEON:  Majesta Leichter SUZANNE  ASSISTANTS: ROMINE, CYNTHIA   ANESTHESIA:   general  ESTIMATED BLOOD LOSS: 50cc BLOOD ADMINISTERED:none   FLUIDS: 2000cc LR   UOP: 150 cc clear  SPECIMEN:  Uterus, cervix, bilateral tubes and ovaries  DISPOSITION OF SPECIMEN:  PATHOLOGY  FINDINGS: normal pelvis, normal upper abdomen  DESCRIPTION OF OPERATION: Patient is taken to the operating room. She is placed in the supine position. She is a running IV in place. Informed consent was present on the chart. SCDs on her lower extremities and functioning properly. General endotracheal anesthesia was administered by the anesthesia staff without difficulty. Dr. Jean Rosenthal oversaw case. Once adequate anesthesia was confirmed the legs are placed in the low lithotomy position in [place] stirrups. The patient was already on a beanbag. Her arms were tucked by the side. The beanbag was inflated to ensure that there would be no movement during the Trendelenburg placement.  Chlor prep was then used to prep the abdomen and Betadine was used to prep the inner thighs, perineum and vagina. Once 3 minutes had past the patient was draped in a normal standard fashion. The legs were lifted to the high lithotomy position. The cervix was visualized by placing a heavy weighted speculum in the posterior aspect of the vagina and using a curved Deaver retractor to the retract anteriorly. The anterior lip of the cervix was grasped with single-tooth tenaculum.  Stitch of 0.0 Vicryl was placed on each side of the cervix at 3 and 9 o'clock positions.  The cervix sounded to 7 cm. Pratt dilators were  used to dilate the cervix up to a #21. A RUMI uterine manipulator was obtained. A #6 disposable tip was placed on the RUMI manipulator as well as a small KOH ring. This was passed through the cervix and the bulb of the disposable tip was inflated with 10 cc of normal saline. There was a good fit of the KOH ring around the cervix. The tenaculum was removed. There is also good manipulation of the uterus. The speculum and retractor were removed as well. A Foley catheter was placed to straight drain. The concentrated urine was noted. Legs were lowered to the low lithotomy position and attention was turned the abdomen.  Ropivacaine mixture (0.5% mixed one-to-one with normal saline) was used anesthetize the skin above the umbilicus.  A 5mm laparoscope with optiview port was passed in the left upper quadrant 2cm below the midclavicular line.   Once intraabdominal placement was confirmed.  The upper abdomen was surveyed without an abnormal findings.  The pelvis was also normal in appearance.  60 cc ropivacaine mixture was used help with perioperative pain control.   The 12mm midline port was placed with direct entry.  Then locations for the 1 and #2 arm ports were identified.  Skin was transilluminated and then anesthetized with the Ropivacaine mixture.  8mm skin incisions were made about 10 cm lateral to the umbilicus on each side. Then 8mm nondisposable trocar ports were passed directly into the abdomen. The table was placed on the floor and the patient was placed in Trendelenburg.  The robot was docked in a normal standard fashion to the left of the table. In the #  1 arm was placed endoscopic scissors with monopolar cautery attached and then the #2 arm was placed PK [place] with bipolar cautery attached. The systems are right side of the table for the right lower quadrant incision was located.  Attention was turned to the right side. With uterus on stretch the right, the right IP ligament was serially clamped,  cauterized, and incised.  Right round ligament was serially clamped cauterized and incised. The anterior and posterior peritoneum of the inferior leaf of the broad ligament were opened. The anterior peritoneum was dissected past midline and the filmy connective tissue with dissected between the vagina and bladder.   The bladder was taken down below the level of the KOH ring. The right uterine artery skeletonized and then just superior to the KOH ring this vessel was serially clamped, cauterized, and incised.  Attention was turned the left side  the call was taken off the sidewall with sharp dissection.  The left IP ligament was serially clamped, cauterized, and incised.  Next the left round ligament was serially clamped cauterized and incised. The anterior posterior peritoneum of the inferiorly for the broad ligament were opened. The anterior peritoneum was carried across to the dissection on the right side. The remainder of the bladder flap was created using sharp dissection. The bladder was well below the level of the KOH ring. The left uterine artery skeletonized. Then the left uterine artery, above the level of the KOH ring, was serially clamped cauterized and incised. The uterus was devascularized at this point.  The colpotomy was performed a starting in the midline and using monopolar cautery with an open edge of the scissors. This was carried around a circumferential fashion until the vaginal mucosa was completely incised in the specimen was freed.  The specimen was then delivered to the vagina.  A vaginal occlusive device was used to maintain the pneumoperitoneum  Instruments were changed with a needle cut suture driver placed in arm 1 and a Cobra grasper placed #2. A V. lock suture was passed through the middle port. Starting in the right angle, the cuff was closed incorporating the anterior and posterior vaginal mucosa in each stitch. This was carried across all the way to the left corner and a  running fashion. To stitches were brought back towards the midline and the suture was cut flush with the vagina. The needle was brought out the pelvis. The pelvis was irrigated. All pedicles were inspected. No bleeding was noted. In Interceed was placed across vaginal cuff. Ureters were noted deep in the pelvis to be peristalsing.  At this point the procedure was completed. The instruments were removed. The robot was undocked. The patient was taken out of Trendelenburg positioning. The ports were removed under direct vision shove laparoscope and the pneumoperitoneum was relieved. Several deep breaths were given to the patient's trying to any gas the abdomen and finally the midline port was removed.  The midline port was closed at the fascial level with figure-of-eight suture of #0 Vicryl. The skin was then closed with subcuticular stitches of 3-0 Vicryl. The skin was cleansed and steri strips were applied. Attention was then turned the vagina and the cuff was inspected. No bleeding was noted. The anterior posterior vaginal mucosa was incorporated in each stitch. The Foley catheter was left in place. Sponge, lap, needle, initially counts were correct x2. Patient tolerated the procedure very well. She was awakened from anesthesia, extubated and taken to recovery in stable condition.    COUNTS:  YES  PLAN OF CARE: Transfer to PACU

## 2013-02-21 NOTE — Telephone Encounter (Signed)
Patient scheduled for surgery today and Dr Hyacinth Meeker was to discuss all concerns at hospital.

## 2013-02-21 NOTE — Progress Notes (Signed)
Day of Surgery Procedure(s) (LRB): ROBOTIC ASSISTED TOTAL HYSTERECTOMY WITH BILATERAL SALPINGO-OOPHORECTOMY (Bilateral) CYSTOSCOPY (N/A)  Subjective: Patient reports incisional pain and tolerating PO.  She would like catheter removed.  Objective: I have reviewed patient's vital signs, intake and output, medications and labs.  General: alert and cooperative Resp: clear to auscultation bilaterally Cardio: regular rate and rhythm, S1, S2 normal, no murmur, click, rub or gallop GI: soft, non-tender; bowel sounds normal; no masses,  no organomegaly and incision: clean, dry and intact Extremities: extremities normal, atraumatic, no cyanosis or edema and SCDs in place and functioning Vaginal Bleeding: minimal  Assessment: s/p Procedure(s): ROBOTIC ASSISTED TOTAL HYSTERECTOMY WITH BILATERAL SALPINGO-OOPHORECTOMY (Bilateral) CYSTOSCOPY (N/A): progressing well  Plan: Advance diet Encourage ambulation Foley out  LOS: 0 days    Valentina Shaggy SUZANNE 02/21/2013, 6:06 PM

## 2013-02-22 ENCOUNTER — Encounter (HOSPITAL_COMMUNITY): Payer: Self-pay | Admitting: Obstetrics & Gynecology

## 2013-02-22 ENCOUNTER — Telehealth: Payer: Self-pay | Admitting: Obstetrics & Gynecology

## 2013-02-22 DIAGNOSIS — N939 Abnormal uterine and vaginal bleeding, unspecified: Secondary | ICD-10-CM

## 2013-02-22 LAB — CBC
HCT: 36 % (ref 36.0–46.0)
RBC: 3.97 MIL/uL (ref 3.87–5.11)
RDW: 14.4 % (ref 11.5–15.5)
WBC: 9.5 10*3/uL (ref 4.0–10.5)

## 2013-02-22 NOTE — Telephone Encounter (Signed)
Patient notified of Dr. Hyacinth Meeker response of shoulder discomfort from the laparoscopic air use during procedure . This  Might take 4-5 days to resolve and this feeling is to be expected. Patient notified that his shoulder discomfort is not related to mastectomy of 2011 since she did not have a lymph node dissection. Patient to observe if has any swelling in her hand at IV site. Explained per Dr. Hyacinth Meeker may use heat to the area . If uses heating pad precautions given to have towel or pillowcase protection over heating pad. Do not go to sleep with heating pad on and to keep at a low setting when in use. Explained to patient only medication given during procedure was antibiotic given through IV. She needs to use sunscreen while at poolside. Patient appreciative of information given.

## 2013-02-22 NOTE — Discharge Summary (Signed)
Physician Discharge Summary  Patient ID: Ariel Mccall MRN: [id number] DOB/AGE: 04/12/1956 57 y.o.  Admit date: 02/21/2013 Discharge date: 02/22/2013  Admission Diagnoses: AUB, H/O bilateral breast cancer, Ovary removal recommended by oncology  Discharge Diagnoses:  Active Problems:   * No active hospital problems. *   Discharged Condition: good  Hospital Course: admitted through same day surgery.  TLH/BSO performed without complications.  EBL 50cc.  Pt went to PACU with catheter in place.  PACU stay uneventful.  Pt transferred to third floor.  From there she was able to transition to regular diet, ambulate on own, transition to oral pain meds, and void on own after catheter was removed.  VSS/AS throughout post-op care.  Post op hb 12.0  Patient seen on POD#0 and POD#1.  Discharge felt appropriate in AM of POD #1.  Consults: None  Significant Diagnostic Studies: labs: post ob hb 12.0  Treatments: surgery: robotic assisted TLH/BSO  Discharge Exam: Blood pressure 141/70, pulse 73, temperature 97.6 F (36.4 C), temperature source Oral, resp. rate 18, height 5\' 3"  (1.6 m), weight 163 lb (73.936 kg), SpO2 96.00%. General appearance: alert and cooperative Resp: clear to auscultation bilaterally Cardio: regular rate and rhythm, S1, S2 normal, no murmur, click, rub or gallop GI: soft, non-tender; bowel sounds normal; no masses,  no organomegaly Extremities: extremities normal, atraumatic, no cyanosis or edema Incision/Wound:clean/dry/intact  Disposition: 01-Home or Self Care  Discharge Orders   Future Appointments Provider Department Dept Phone   03/21/2013 1:30 PM Annamaria Boots, MD [place] [facility] [phone]   05/23/2013 4:00 PM Lowella Dell, MD [facility] [phone]   12/29/2013 3:30 PM Verner Chol, CNM [place] [facility] [phone]   Future Orders Complete By Expires     Call MD for:  persistant nausea  and vomiting  As directed     Call MD for:  redness, tenderness, or signs of infection (pain, swelling, redness, odor or green/yellow discharge around incision site)  As directed     Call MD for:  severe uncontrolled pain  As directed     Call MD for:  temperature >100.4  As directed     Call MD for:  As directed     Comments:      Heavy vaginal bleeding, like a menstrual cycle    Diet general  As directed     Discharge instructions  As directed     Comments:      Post Op Hysterectomy Instructions Please read the instructions below. Refer to these instructions for the next few weeks. These instructions provide you with general information on caring for yourself after surgery. Your caregiver may also give you specific instructions. While your treatment has been planned according to the most current medical practices available, unavoidable problems sometimes happen. If you have any problems or questions after you leave, please call your caregiver.  HOME CARE INSTRUCTIONS Healing will take time. You will have discomfort, tenderness, swelling and bruising at the operative site for a couple of weeks. This is normal and will get better as time goes on.  Only take over-the-counter or prescription medicines for pain, discomfort or fever as directed by your caregiver.  Do not take aspirin. It can cause bleeding.  Do not drive when taking pain medication.  Follow your caregiver's advice regarding diet, exercise, lifting, driving and general activities.  Resume your usual diet as directed and allowed.  Get plenty of rest and sleep.  Do not  douche, use tampons, or have sexual intercourse until your caregiver gives you permission. .  Take your temperature if you feel hot or flushed.  You may shower today when you get home.  No tub bath for one week.   Do not drink alcohol until you are not taking any narcotic pain medications.  Try to have someone home with you for a week or two to help with the  household activities.   Be careful over the next two to three weeks with any activities at home that involve lifting, pushing, or pulling.  Listen to your body--if something feels uncomfortable to do, then don't do it. Make sure you and your family understands everything about your operation and recovery.  Walking up stairs is fine. Do not sign any legal documents until you feel normal again.  Keep all your follow-up appointments as recommended by your caregiver.   PLEASE CALL THE OFFICE IF: There is swelling, redness or increasing pain in the wound area.  Pus is coming from the wound.  You notice a bad smell from the wound or surgical dressing.  You have pain, redness and swelling from the intravenous site.  The wound is breaking open (the edges are not staying together).   You develop pain or bleeding when you urinate.  You develop abnormal vaginal discharge.  You have any type of abnormal reaction or develop an allergy to your medication.   You need stronger pain medication for your pain   SEEK IMMEDIATE MEDICAL CARE: You develop a temperature of 100.5 or higher.  You develop abdominal pain.  You develop chest pain.  You develop shortness of breath.  You pass out.  You develop pain, swelling or redness of your leg.  You develop heavy vaginal bleeding with or without blood clots.   MEDICATIONS: Restart your regular medications BUT wait one week before restarting all vitamins and mineral supplements Use Motrin 800mg  every 8 hours for the next several days.  This will help you use less Percocet.  Use the Percocet 5/325 1-2 tabs every 4-6 hours as needed for pain. You may use an over the counter stool softener like Colace or Dulcolax to help with starting a bowel movement.  Start the day after you go home.  Warm liquids, fluids, and ambulation help too.  If you have not had a bowel movement in four days, you need to call the office.    Discharge wound care:  As directed     Comments:       Use soap and water, only, on incisions    Driving Restrictions  As directed     Comments:      May drive in one week    Increase activity slowly  As directed     Lifting restrictions  As directed     Comments:      No heavy lifting (>15 pounds) for 4 weeks    May shower / Bathe  As directed     Comments:      May shower on post-operative day 1.  May take a tub bath in one week.    May walk up steps  As directed     Other Restrictions  As directed     Comments:      Return to work as discussed with Dr. Hyacinth Meeker    Remove dressing in 24 hours  As directed     Sexual Activity Restrictions  As directed     Comments:  Nothing in the vagina for 8 weeks, intercourse/tampons/douching        Medication List         aspirin-acetaminophen-caffeine 250-250-65 MG per tablet  Commonly known as:  EXCEDRIN MIGRAINE  Take 1 tablet by mouth every 6 (six) hours as needed (migraine).     doxylamine (Sleep) 25 MG tablet  Commonly known as:  UNISOM  Take 25 mg by mouth at bedtime as needed for sleep.     DULoxetine 60 MG capsule  Commonly known as:  CYMBALTA  Take 60 mg by mouth daily.     ibuprofen 800 MG tablet  Commonly known as:  ADVIL,MOTRIN  Take 1 tablet (800 mg total) by mouth every 8 (eight) hours as needed for pain.     Melatonin 5 MG Caps  Take 5-10 mg by mouth at bedtime.     MILK THISTLE PO  Take 1 capsule by mouth as needed.     omeprazole 20 MG capsule  Commonly known as:  PRILOSEC  Take 20 mg by mouth daily as needed (acid reflux).     oxyCODONE-acetaminophen 5-325 MG per tablet  Commonly known as:  ROXICET  Take 1 tablet by mouth every 4 (four) hours as needed for pain.     PAPAYA PO  Take 1 capsule by mouth as needed (enzyme).     PROBIOTIC DAILY PO  Take 1 capsule by mouth as needed.     STOOL SOFTENER PO  Take by mouth. [employer] Brand     VALERIAN ROOT PO  Take 1 capsule by mouth daily as needed (nerves).         SignedAnnamaria Boots 02/22/2013, 10:24 AM

## 2013-02-22 NOTE — Telephone Encounter (Signed)
Patient called concern of discomfort in her right shoulder when she got out of car from hospital. Stated her IV was in this arm and her blood pressure was taken from this arm during the procedure. She did take percoset when got home and is not too bad now. Concern that in 2011 right mastectomy was done and just making sure this is normal to feel this. Explained to patient her right arm was stabilized in a position for her IV and blood pressure during her surgery . States she has been doing range of motion in her right arm to keep from getting stiff. Patient also asking advice due to medication she is on if OK to have sun exposure while sitting at pool. Please advise.

## 2013-02-22 NOTE — Progress Notes (Signed)
1 Day Post-Op Procedure(s) (LRB): ROBOTIC ASSISTED TOTAL HYSTERECTOMY WITH BILATERAL SALPINGO-OOPHORECTOMY (Bilateral) CYSTOSCOPY (N/A)  Subjective: Patient reports tolerating PO, + flatus and no problems voiding.    Objective: I have reviewed patient's vital signs, intake and output, medications and labs.  General: alert and cooperative Resp: clear to auscultation bilaterally Cardio: regular rate and rhythm, S1, S2 normal, no murmur, click, rub or gallop GI: soft, non-tender; bowel sounds normal; no masses,  no organomegaly Extremities: extremities normal, atraumatic, no cyanosis or edema Vaginal Bleeding: none  Assessment: s/p Procedure(s): ROBOTIC ASSISTED TOTAL HYSTERECTOMY WITH BILATERAL SALPINGO-OOPHORECTOMY (Bilateral) CYSTOSCOPY (N/A): stable and progressing well  Plan: Discharge home  LOS: 1 day    Valentina Shaggy SUZANNE 02/22/2013, 10:20 AM

## 2013-02-22 NOTE — Telephone Encounter (Signed)
Shoulder pain is most likely from laparoscopic air.  Heat works best for this pain.  She did not have a lymph node dissection with her mastectomy in 2011, so it does not matter where her IV was located or BP was taken.  Sun exposure fine--just wear sunscreen.

## 2013-02-22 NOTE — Anesthesia Postprocedure Evaluation (Signed)
  Anesthesia Post-op Note  Patient: Ariel Mccall  Procedure(s) Performed: Procedure(s): ROBOTIC ASSISTED TOTAL HYSTERECTOMY WITH BILATERAL SALPINGO-OOPHORECTOMY (Bilateral) CYSTOSCOPY (N/A)  Patient Location: [facility]  Anesthesia Type:General  Level of Consciousness: awake, alert  and oriented  Airway and Oxygen Therapy: Patient Spontanous Breathing  Post-op Pain: mild  Post-op Assessment: Post-op Vital signs reviewed, Patient's Cardiovascular Status Stable, Respiratory Function Stable, Patent Airway, No signs of Nausea or vomiting, Adequate PO intake and Pain level controlled  Post-op Vital Signs: Reviewed and stable  Complications: No apparent anesthesia complications

## 2013-02-22 NOTE — Telephone Encounter (Signed)
Had surgery this morning by Dr. Hyacinth Meeker and has a couple of questions she'd like to ask.

## 2013-02-22 NOTE — Progress Notes (Signed)
Pt ambulated out  Teaching complete 

## 2013-02-28 NOTE — Telephone Encounter (Signed)
Patient  is asking if she could ride in a care after having surgery.

## 2013-02-28 NOTE — Telephone Encounter (Signed)
Patient had Robotic Assisted Total Hysterectomy/ cystoscopy done on July 8th per Dr. Hyacinth Meeker. Patient called today with concern if OK to ride in a car to [place] and then to [place], [place] , area for her niece wedding. She would not be driving , just riding in car and would take short breaks also . Question also if it is ok for her to get in pool and wade around or be on fun doodle in the pool. States that laparoscopic areas are dry and healing with no drainage or redness .  Please advise.

## 2013-03-01 NOTE — Telephone Encounter (Signed)
Should be fine to ride.  Just make sure she moves her legs around while rising.  She still has a slightly increased risk of blood clots for six full weeks after surgery.  Also, get out and walk around every two to three hours.  If she is not bleeding, she can wade in pool.  No significant swimming.  Ok to lounge on pool floatation devices.Marland Kitchen

## 2013-03-01 NOTE — Telephone Encounter (Signed)
Patient notified of Dr. Hyacinth Meeker response and understands risk of blood clots. States she will be riding with older people and will defiantly be stopping often for them also. Appreciates the call back . States she has been pleased with her surgery.

## 2013-03-11 ENCOUNTER — Telehealth: Payer: Self-pay | Admitting: Obstetrics & Gynecology

## 2013-03-11 NOTE — Telephone Encounter (Signed)
Spoke with pt about pain. Pt has been doing well, with no pain after the 3rd day postop, until last night. Sharp pains "where my ovaries used to be" woke her up during the night. It is better today. No fever, bleeding, or discharge. Pt doesn't think she has overdone any activity. Hasn't lifted anything heavy. Pt has driven, but seemed fine afterwards. Pt just wanted to let Dr. Hyacinth Meeker know.

## 2013-03-11 NOTE — Telephone Encounter (Signed)
1. Patient calling re: "Having pain last night at surgery site?" Reports she feels better today but still wants to speak with nurse.  2. Also, patient has forms for an insurance claim she wants completed. She will drop them off or fax them to [place], we just need to call her for payment after determining the length of the form.

## 2013-03-21 ENCOUNTER — Encounter: Payer: Self-pay | Admitting: Obstetrics & Gynecology

## 2013-03-21 ENCOUNTER — Ambulatory Visit (INDEPENDENT_AMBULATORY_CARE_PROVIDER_SITE_OTHER): Payer: BC Managed Care – PPO | Admitting: Obstetrics & Gynecology

## 2013-03-21 VITALS — BP 118/80 | HR 78 | Resp 16 | Ht 62.75 in | Wt 155.2 lb

## 2013-03-21 DIAGNOSIS — Z9889 Other specified postprocedural states: Secondary | ICD-10-CM

## 2013-03-21 NOTE — Progress Notes (Signed)
Post Operative Visit  Procedure:Robotic TLH/BSO/cystoscopy Days Post-op: one month post op  Subjective: Doing well.  Normal bowel movement. No pain.  No vaginal bleeding.  Reviewed pathology and images from surgery.  Objective: BP 118/80  Pulse 78  Resp 16  Ht 5' 2.75" (1.594 m)  Wt 155 lb 3.2 oz (70.398 kg)  BMI 27.71 kg/m2  EXAM General: alert and cooperative GI: soft, non-tender; bowel sounds normal; no masses,  no organomegaly and incision: clean, dry and intact Extremities: extremities normal, atraumatic, no cyanosis or edema Vaginal Bleeding: none and cuff intact and healing well, no masses  Assessment: s/p robotic assisted TLH/BSO/cystoscopy  Plan: Recheck 1 year May decide she is not going to follow up with [place] as she's had prophylactic mastectomies and now with TLH/BSO has no remaining gyn organs.

## 2013-03-21 NOTE — Telephone Encounter (Signed)
4 weeks out from surgery . What are limitations, can she start walking can she go to the gym?

## 2013-03-21 NOTE — Patient Instructions (Signed)
Call if you have any questions or concerns.

## 2013-03-22 ENCOUNTER — Ambulatory Visit: Payer: Self-pay | Admitting: Obstetrics & Gynecology

## 2013-03-22 NOTE — Telephone Encounter (Signed)
Left message on CB# of need to return call concerning question of activities post op.

## 2013-03-23 NOTE — Telephone Encounter (Signed)
Patient notified on CB# cell number of instruction for walking on tredmill at normal pace. May do swimming gradually, instead of lap swimming may just do walking in lap lane or pool., no heavy lifting,  No SA for another 2 weeks. Remember she has had surgery and is still healing on inside. Patient to call back if any questions or needs.

## 2013-05-23 ENCOUNTER — Ambulatory Visit (HOSPITAL_BASED_OUTPATIENT_CLINIC_OR_DEPARTMENT_OTHER): Payer: BC Managed Care – PPO | Admitting: Oncology

## 2013-05-23 VITALS — BP 130/83 | HR 91 | Temp 98.3°F | Resp 20 | Ht 62.75 in | Wt 156.6 lb

## 2013-05-23 DIAGNOSIS — D0590 Unspecified type of carcinoma in situ of unspecified breast: Secondary | ICD-10-CM

## 2013-05-23 DIAGNOSIS — C50212 Malignant neoplasm of upper-inner quadrant of left female breast: Secondary | ICD-10-CM

## 2013-05-23 DIAGNOSIS — C50411 Malignant neoplasm of upper-outer quadrant of right female breast: Secondary | ICD-10-CM | POA: Insufficient documentation

## 2013-05-23 DIAGNOSIS — M899 Disorder of bone, unspecified: Secondary | ICD-10-CM

## 2013-05-23 DIAGNOSIS — C50419 Malignant neoplasm of upper-outer quadrant of unspecified female breast: Secondary | ICD-10-CM

## 2013-05-23 DIAGNOSIS — F39 Unspecified mood [affective] disorder: Secondary | ICD-10-CM
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
## 2013-05-24 NOTE — Addendum Note (Signed)
Addended by: Billey Co on: 05/24/2013 04:49 PM   Modules accepted: Orders

## 2013-05-26 ENCOUNTER — Telehealth: Payer: Self-pay | Admitting: Oncology

## 2013-05-26 NOTE — Telephone Encounter (Signed)
lvm for pt regarding to OCT 2015 appt...mailed pt appt sched and avs with letter

## 2013-06-23 ENCOUNTER — Other Ambulatory Visit: Payer: Self-pay

## 2013-07-18 HISTORY — PX: PLACEMENT OF BREAST IMPLANTS: SHX6334

## 2013-12-29 ENCOUNTER — Ambulatory Visit: Payer: BC Managed Care – PPO | Admitting: Certified Nurse Midwife

## 2014-01-11 DIAGNOSIS — Z0289 Encounter for other administrative examinations: Secondary | ICD-10-CM

## 2014-01-30 DIAGNOSIS — Z0289 Encounter for other administrative examinations: Secondary | ICD-10-CM

## 2014-01-31 ENCOUNTER — Telehealth: Payer: Self-pay | Admitting: Certified Nurse Midwife

## 2014-01-31 NOTE — Telephone Encounter (Signed)
Patient is calling about a bill and her supplemental insurance

## 2014-01-31 NOTE — Telephone Encounter (Signed)
Called patient back to explain her account is at 0. Records that were requested were paid for and mailed out yesterday.

## 2014-03-21 ENCOUNTER — Ambulatory Visit: Payer: BC Managed Care – PPO | Admitting: Certified Nurse Midwife

## 2014-03-27 ENCOUNTER — Encounter: Payer: Self-pay | Admitting: Certified Nurse Midwife

## 2014-03-27 ENCOUNTER — Ambulatory Visit (INDEPENDENT_AMBULATORY_CARE_PROVIDER_SITE_OTHER): Payer: BC Managed Care – PPO | Admitting: Certified Nurse Midwife

## 2014-03-27 VITALS — BP 110/74 | HR 68 | Resp 16 | Ht 62.75 in | Wt 160.0 lb

## 2014-03-27 DIAGNOSIS — F419 Anxiety disorder, unspecified: Secondary | ICD-10-CM

## 2014-03-27 DIAGNOSIS — F341 Dysthymic disorder: Secondary | ICD-10-CM

## 2014-03-27 DIAGNOSIS — F32A Depression, unspecified: Secondary | ICD-10-CM

## 2014-03-27 DIAGNOSIS — Z01419 Encounter for gynecological examination (general) (routine) without abnormal findings: Secondary | ICD-10-CM

## 2014-03-27 DIAGNOSIS — F329 Major depressive disorder, single episode, unspecified: Secondary | ICD-10-CM

## 2014-03-27 NOTE — Patient Instructions (Signed)
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
## 2014-03-29 NOTE — Progress Notes (Signed)
Reviewed personally.  M. Suzanne Dayson Aboud, MD.  

## 2014-05-23 ENCOUNTER — Ambulatory Visit: Payer: BC Managed Care – PPO | Admitting: Oncology

## 2014-05-24 ENCOUNTER — Telehealth: Payer: Self-pay | Admitting: Oncology

## 2014-05-24 NOTE — Telephone Encounter (Signed)
Sent letter to patient from Dr. Magrinat. °

## 2014-06-19 ENCOUNTER — Encounter: Payer: Self-pay | Admitting: Certified Nurse Midwife

## 2014-07-20 ENCOUNTER — Telehealth: Payer: Self-pay | Admitting: Oncology

## 2014-07-20 NOTE — Telephone Encounter (Signed)
pt left vm wanting appt w/GM-adv GM was booked til Feb-adv we could sch with HF-aadv pt to call and let us knowa convienent time & date to sch

## 2014-08-01 ENCOUNTER — Other Ambulatory Visit: Payer: Self-pay | Admitting: Emergency Medicine

## 2014-08-07 ENCOUNTER — Other Ambulatory Visit: Payer: Self-pay | Admitting: *Deleted

## 2014-08-07 DIAGNOSIS — C50411 Malignant neoplasm of upper-outer quadrant of right female breast: Secondary | ICD-10-CM

## 2014-08-08 ENCOUNTER — Telehealth: Payer: Self-pay | Admitting: Oncology

## 2014-08-08 ENCOUNTER — Ambulatory Visit (HOSPITAL_BASED_OUTPATIENT_CLINIC_OR_DEPARTMENT_OTHER): Payer: BC Managed Care – PPO | Admitting: Lab

## 2014-08-08 ENCOUNTER — Ambulatory Visit (HOSPITAL_BASED_OUTPATIENT_CLINIC_OR_DEPARTMENT_OTHER): Payer: BC Managed Care – PPO | Admitting: Nurse Practitioner

## 2014-08-08 ENCOUNTER — Encounter: Payer: Self-pay | Admitting: Nurse Practitioner

## 2014-08-08 VITALS — BP 123/75 | HR 67 | Temp 98.3°F | Resp 18 | Ht 62.75 in | Wt 153.6 lb

## 2014-08-08 DIAGNOSIS — C50212 Malignant neoplasm of upper-inner quadrant of left female breast: Secondary | ICD-10-CM

## 2014-08-08 DIAGNOSIS — C50411 Malignant neoplasm of upper-outer quadrant of right female breast: Secondary | ICD-10-CM

## 2014-08-08 LAB — COMPREHENSIVE METABOLIC PANEL (CC13)
ALT: 13 U/L (ref 0–55)
ANION GAP: 10 meq/L (ref 3–11)
AST: 18 U/L (ref 5–34)
Albumin: 4.1 g/dL (ref 3.5–5.0)
Alkaline Phosphatase: 62 U/L (ref 40–150)
BILIRUBIN TOTAL: 0.88 mg/dL (ref 0.20–1.20)
BUN: 19.1 mg/dL (ref 7.0–26.0)
CALCIUM: 9.2 mg/dL (ref 8.4–10.4)
CO2: 24 meq/L (ref 22–29)
Chloride: 103 mEq/L (ref 98–109)
Creatinine: 0.8 mg/dL (ref 0.6–1.1)
EGFR: 84 mL/min/{1.73_m2} — ABNORMAL LOW (ref >90)
Glucose: 93 mg/dl (ref 70–140)
Potassium: 4.1 mEq/L (ref 3.5–5.1)
SODIUM: 136 meq/L (ref 136–145)
TOTAL PROTEIN: 7.4 g/dL (ref 6.4–8.3)

## 2014-08-08 LAB — CBC WITH DIFFERENTIAL/PLATELET
BASO%: 0.6 % (ref 0.0–2.0)
BASOS ABS: 0 10*3/uL (ref 0.0–0.1)
EOS%: 1.7 % (ref 0.0–7.0)
Eosinophils Absolute: 0.1 10*3/uL (ref 0.0–0.5)
HEMATOCRIT: 41.4 % (ref 34.8–46.6)
HEMOGLOBIN: 13.7 g/dL (ref 11.6–15.9)
LYMPH%: 32.6 % (ref 14.0–49.7)
MCH: 31.8 pg (ref 25.1–34.0)
MCHC: 33.1 g/dL (ref 31.5–36.0)
MCV: 96.1 fL (ref 79.5–101.0)
MONO#: 0.4 10*3/uL (ref 0.1–0.9)
MONO%: 7.4 % (ref 0.0–14.0)
NEUT#: 3 10*3/uL (ref 1.5–6.5)
NEUT%: 57.7 % (ref 38.4–76.8)
Platelets: 279 10*3/uL (ref 145–400)
RBC: 4.31 10*6/uL (ref 3.70–5.45)
RDW: 13.1 % (ref 11.2–14.5)
WBC: 5.3 10*3/uL (ref 3.9–10.3)
lymph#: 1.7 10*3/uL (ref 0.9–3.3)

## 2014-08-08 NOTE — Telephone Encounter (Signed)
, °
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
## 2014-08-09 LAB — CANCER ANTIGEN 27.29: CA 27.29: 17 U/mL (ref 0–39)

## 2014-10-19 IMAGING — US US ASPIRATION
1 series · 6 of 6 positions shown · non-contrast
Comparison: none

Clinical Data/Indication: RECURRENT LEFT PLATYSMUS DORSI FLAPS
SEROMA

US ASPIRATION
Procedure: The procedure, risks, benefits, and alternatives were
explained to the patient. Questions regarding the procedure were
encouraged and answered. The patient understands and consents to
the procedure.
The seroma was localized with sonographic imaging to the left back
healed incision.
The left back was prepped with betadine in a sterile fashion, and a
sterile drape was applied covering the operative field. A sterile
gown and sterile gloves were used for the procedure.
Under sonographic guidance, a yearly angiocath was inserted into
the seroma.  45 ml of serosanguinous fluid was aspirated.  Post
aspiration imaging demonstrates complete resolution of the fluid
collection with decompression and apposition of the walls against
each other.

[Series 1: us aspiration · 0.08mm/px · 6 of 6 slices shown]
[im 1/6]
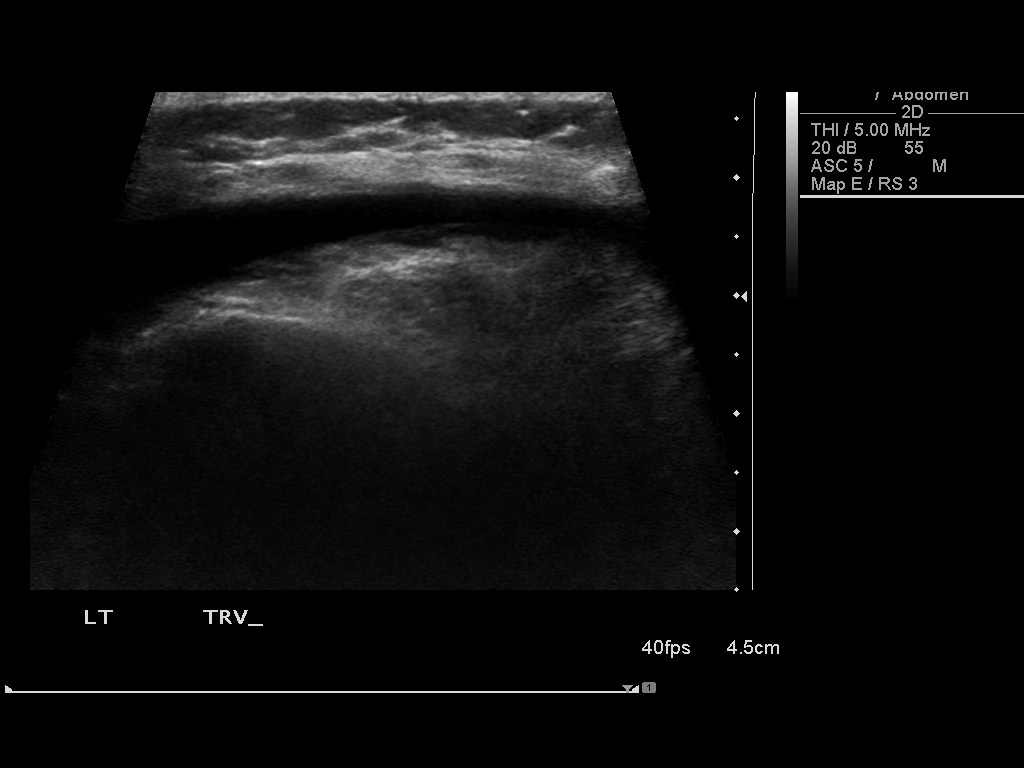
[im 2/6]
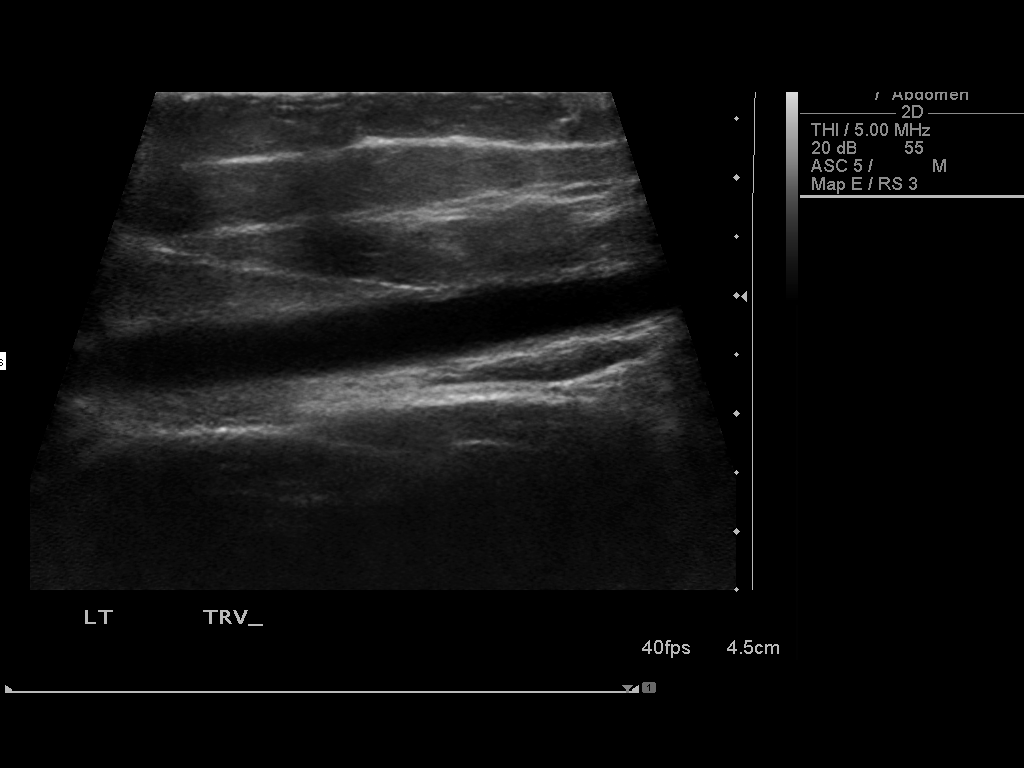
[im 3/6]
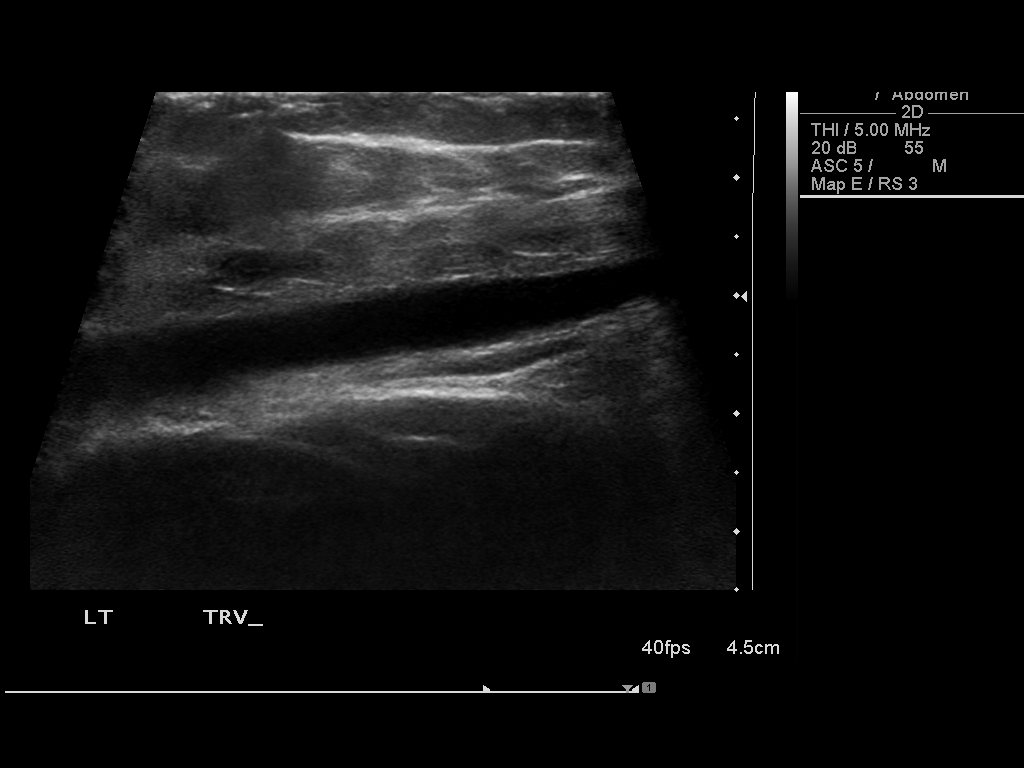
[im 4/6]
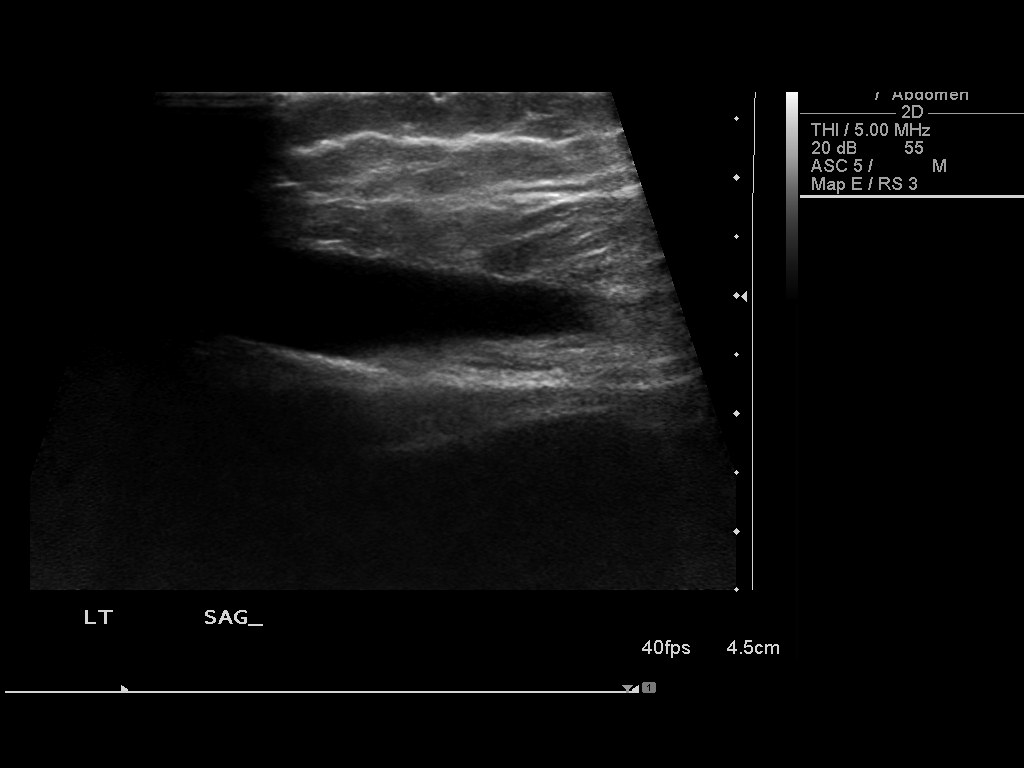
[im 5/6]
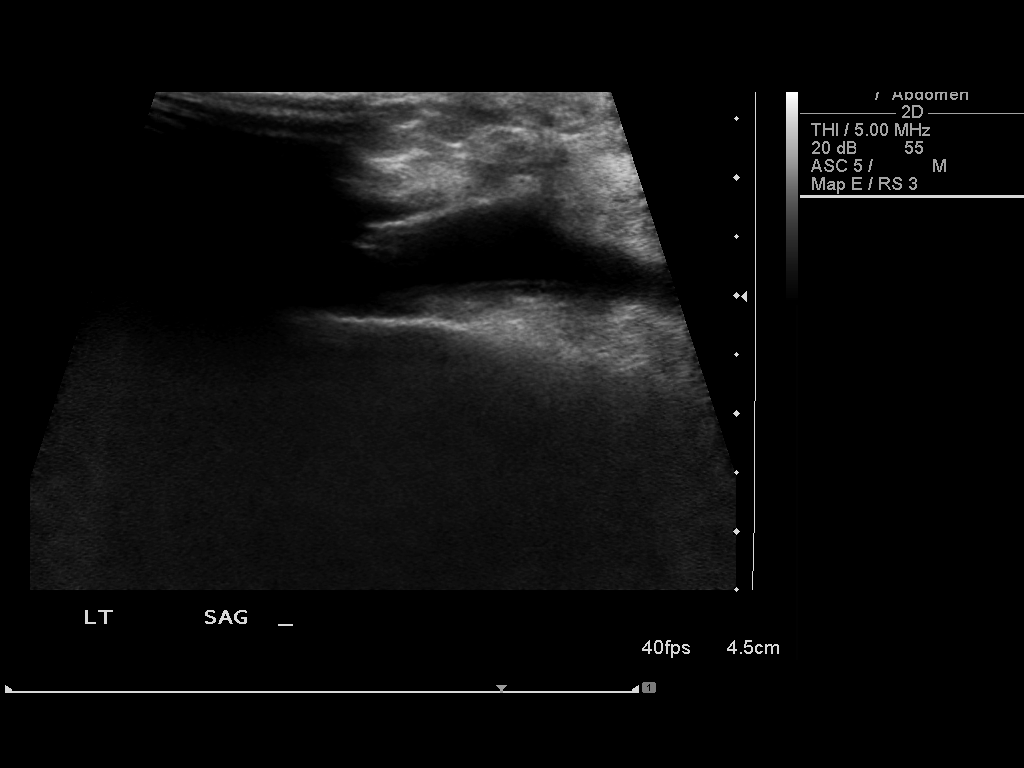
[im 6/6]
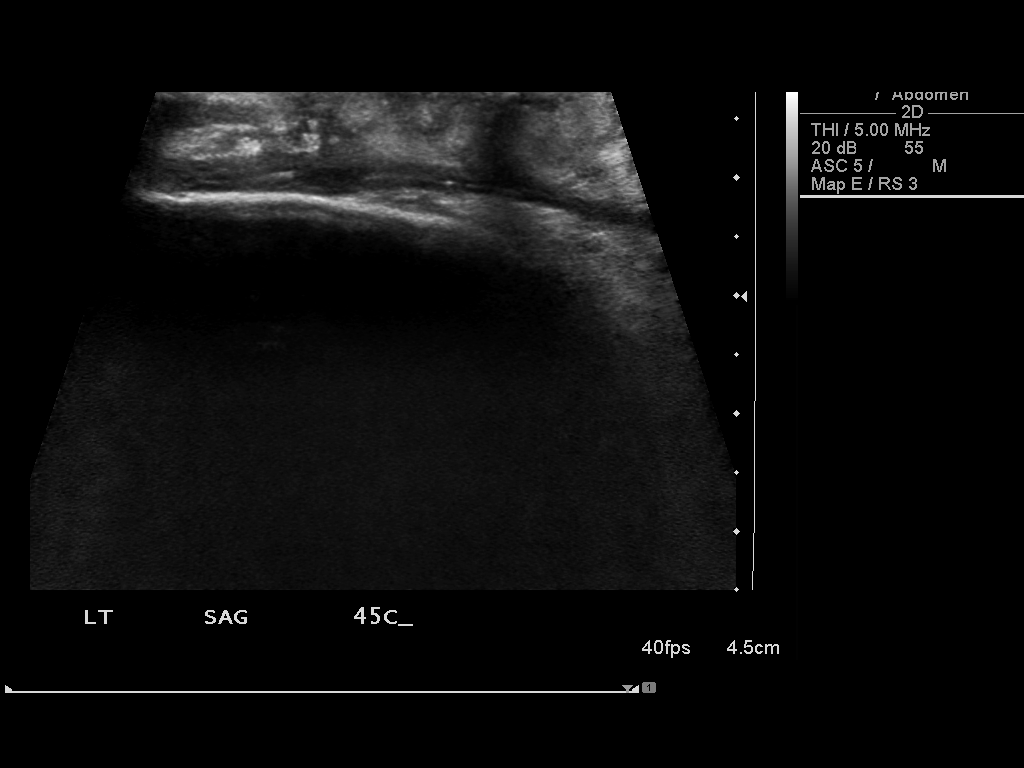

[6 of 6 positions shown; findings below may reference images not displayed]

FINDINGS: Post aspiration imaging demonstrates resolution.

Complications: None.
IMPRESSION: Successful aspiration of a left back seroma.

## 2015-02-22 ENCOUNTER — Telehealth: Payer: Self-pay | Admitting: Oncology

## 2015-02-22 NOTE — Telephone Encounter (Signed)
Left message to confirm appointment change in December due to MD on PAL.

## 2015-08-07 ENCOUNTER — Other Ambulatory Visit (HOSPITAL_BASED_OUTPATIENT_CLINIC_OR_DEPARTMENT_OTHER): Payer: BC Managed Care – PPO

## 2015-08-07 ENCOUNTER — Ambulatory Visit (HOSPITAL_BASED_OUTPATIENT_CLINIC_OR_DEPARTMENT_OTHER): Payer: BC Managed Care – PPO | Admitting: Oncology

## 2015-08-07 VITALS — BP 127/75 | HR 67 | Temp 97.9°F | Resp 18 | Ht 62.75 in | Wt 131.2 lb

## 2015-08-07 DIAGNOSIS — R1011 Right upper quadrant pain: Secondary | ICD-10-CM | POA: Diagnosis not present

## 2015-08-07 DIAGNOSIS — Z853 Personal history of malignant neoplasm of breast: Secondary | ICD-10-CM

## 2015-08-07 DIAGNOSIS — C50212 Malignant neoplasm of upper-inner quadrant of left female breast: Secondary | ICD-10-CM

## 2015-08-07 DIAGNOSIS — C50411 Malignant neoplasm of upper-outer quadrant of right female breast: Secondary | ICD-10-CM

## 2015-08-07 LAB — CBC WITH DIFFERENTIAL/PLATELET
BASO%: 0.3 % (ref 0.0–2.0)
Basophils Absolute: 0 10*3/uL (ref 0.0–0.1)
EOS ABS: 0.1 10*3/uL (ref 0.0–0.5)
EOS%: 0.8 % (ref 0.0–7.0)
HCT: 43.8 % (ref 34.8–46.6)
HEMOGLOBIN: 14.4 g/dL (ref 11.6–15.9)
LYMPH%: 34.7 % (ref 14.0–49.7)
MCH: 32 pg (ref 25.1–34.0)
MCHC: 32.9 g/dL (ref 31.5–36.0)
MCV: 97.3 fL (ref 79.5–101.0)
MONO#: 0.7 10*3/uL (ref 0.1–0.9)
MONO%: 11.2 % (ref 0.0–14.0)
NEUT%: 53 % (ref 38.4–76.8)
NEUTROS ABS: 3.2 10*3/uL (ref 1.5–6.5)
Platelets: 273 10*3/uL (ref 145–400)
RBC: 4.5 10*6/uL (ref 3.70–5.45)
RDW: 13.1 % (ref 11.2–14.5)
WBC: 6 10*3/uL (ref 3.9–10.3)
lymph#: 2.1 10*3/uL (ref 0.9–3.3)

## 2015-08-07 LAB — COMPREHENSIVE METABOLIC PANEL
ALBUMIN: 4.4 g/dL (ref 3.5–5.0)
ALK PHOS: 60 U/L (ref 40–150)
ALT: 15 U/L (ref 0–55)
AST: 18 U/L (ref 5–34)
Anion Gap: 11 mEq/L (ref 3–11)
BILIRUBIN TOTAL: 0.66 mg/dL (ref 0.20–1.20)
BUN: 17.6 mg/dL (ref 7.0–26.0)
CO2: 27 meq/L (ref 22–29)
CREATININE: 0.8 mg/dL (ref 0.6–1.1)
Calcium: 9.5 mg/dL (ref 8.4–10.4)
Chloride: 102 mEq/L (ref 98–109)
EGFR: 81 mL/min/{1.73_m2} — ABNORMAL LOW (ref >90)
GLUCOSE: 82 mg/dL (ref 70–140)
Potassium: 4.1 mEq/L (ref 3.5–5.1)
SODIUM: 139 meq/L (ref 136–145)
TOTAL PROTEIN: 8.1 g/dL (ref 6.4–8.3)
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
## 2015-08-14 ENCOUNTER — Ambulatory Visit: Payer: BC Managed Care – PPO | Admitting: Oncology

## 2015-08-14 ENCOUNTER — Other Ambulatory Visit: Payer: BC Managed Care – PPO

## 2015-08-17 ENCOUNTER — Ambulatory Visit (HOSPITAL_COMMUNITY)
Admission: RE | Admit: 2015-08-17 | Discharge: 2015-08-17 | Disposition: A | Discharge status: Home / Self Care | Payer: BC Managed Care – PPO | Source: Ambulatory Visit | Attending: Oncology | Admitting: Oncology

## 2015-08-17 DIAGNOSIS — R1011 Right upper quadrant pain: Secondary | ICD-10-CM | POA: Diagnosis not present

## 2015-08-17 DIAGNOSIS — C50212 Malignant neoplasm of upper-inner quadrant of left female breast: Secondary | ICD-10-CM

## 2015-08-17 DIAGNOSIS — C50411 Malignant neoplasm of upper-outer quadrant of right female breast: Secondary | ICD-10-CM

## 2015-08-17 DIAGNOSIS — K824 Cholesterolosis of gallbladder: Secondary | ICD-10-CM | POA: Insufficient documentation

## 2015-08-30 ENCOUNTER — Telehealth: Payer: Self-pay | Admitting: *Deleted

## 2015-09-18 NOTE — Telephone Encounter (Signed)
NO ENTRY 

## 2016-01-07 IMAGING — US US ABDOMEN LIMITED
1 series · 14 of 25 positions shown · non-contrast
Comparison: Lumbar MRI 09/23/2007.

CLINICAL DATA: 59-year-old female with 2 weeks of right upper
quadrant pain. Initial encounter.

EXAM:
US ABDOMEN LIMITED - RIGHT UPPER QUADRANT

[Series 1: us abdomen limited · 0.12mm/px · 14 of 29 slices shown]
[im 1/29]
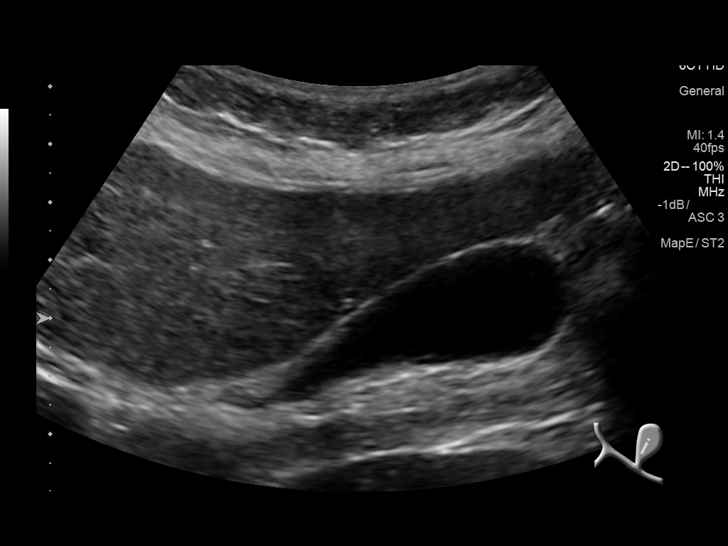
[im 3/29]
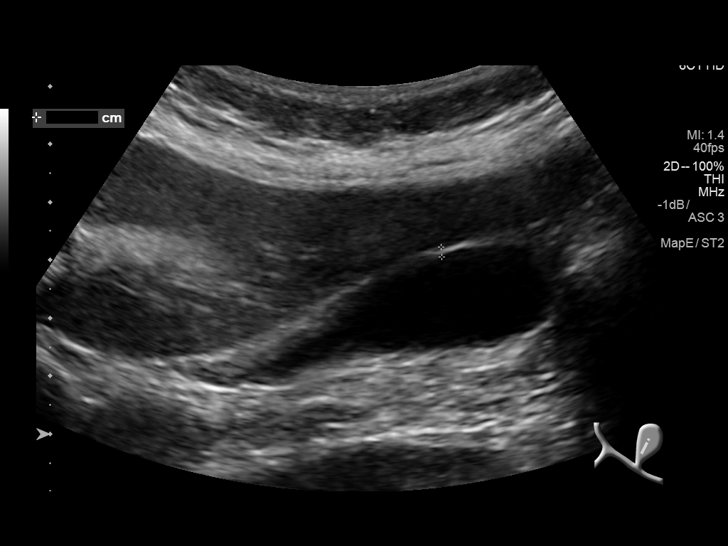
[im 5/29]
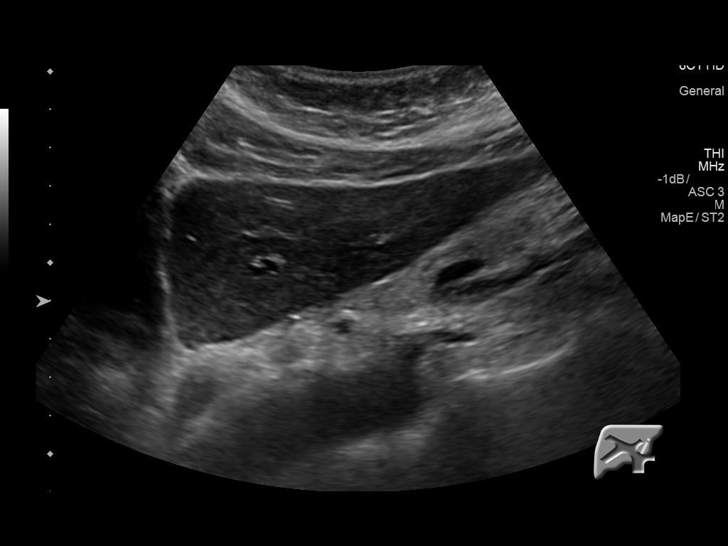
[im 8/29]
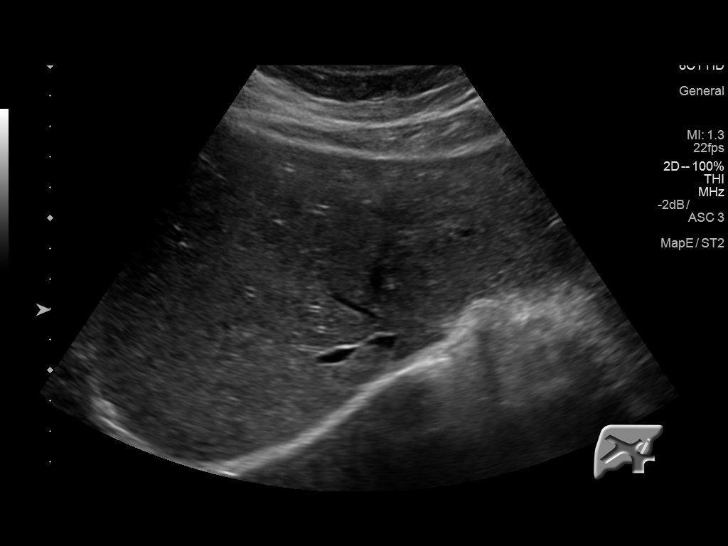
[im 10/29]
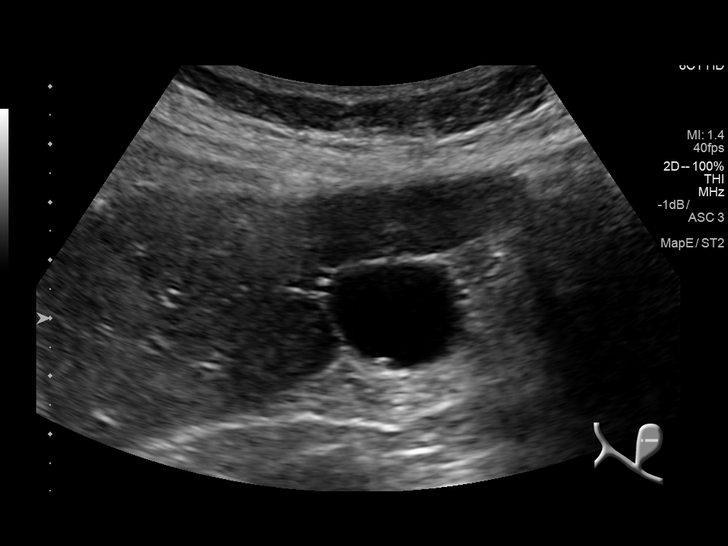
[im 11/29]
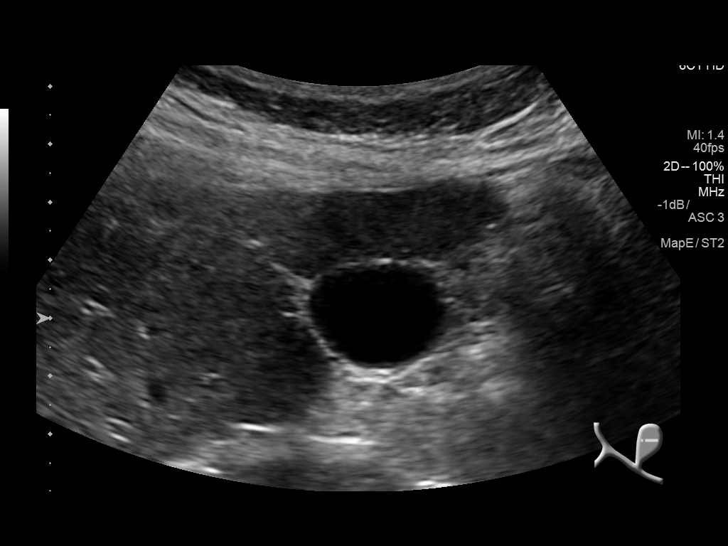
[im 13/29]
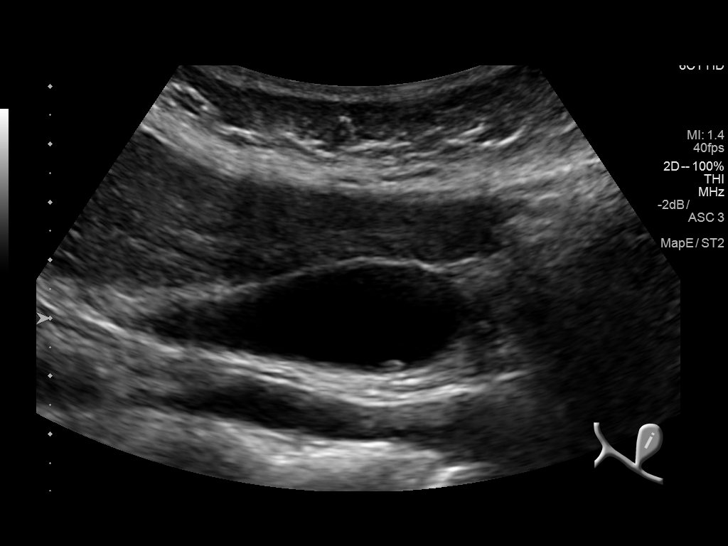
[im 16/29]
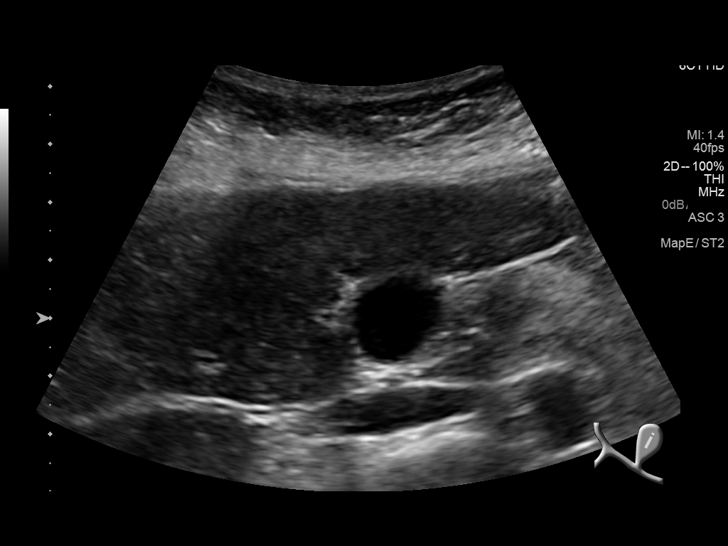
[im 18/29]
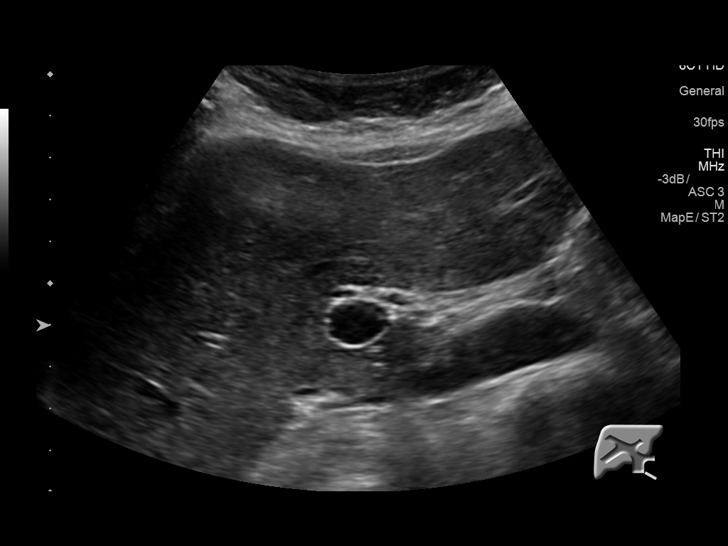
[im 19/29]
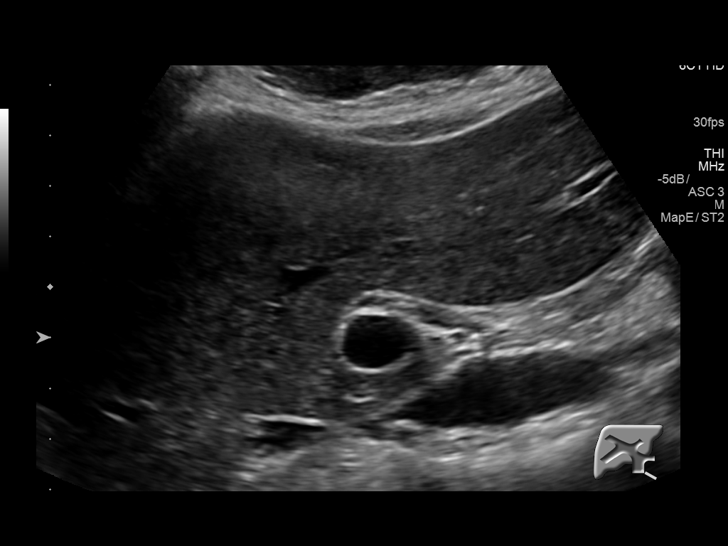
[im 22/29]
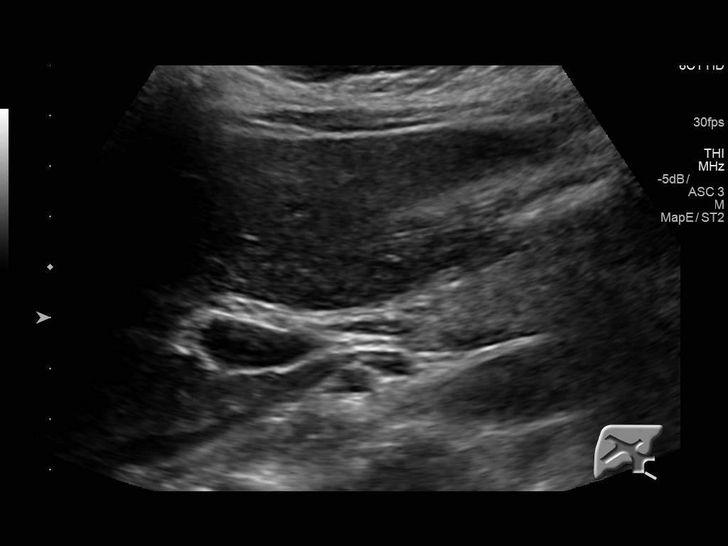
[im 24/29]
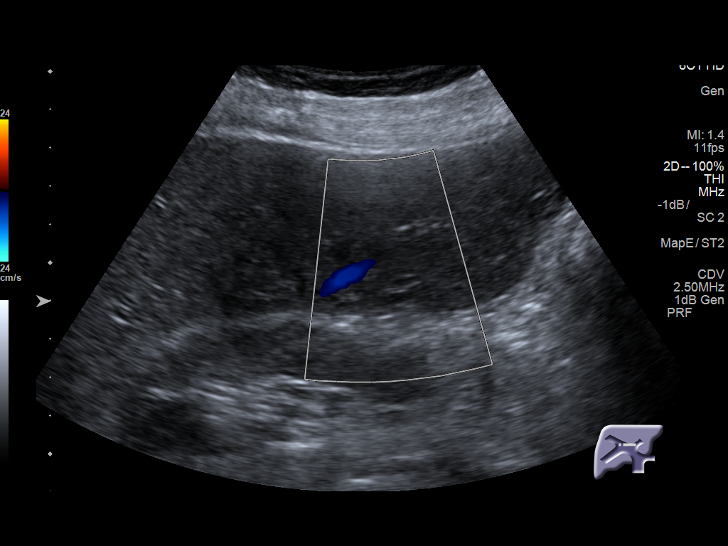
[im 26/29]
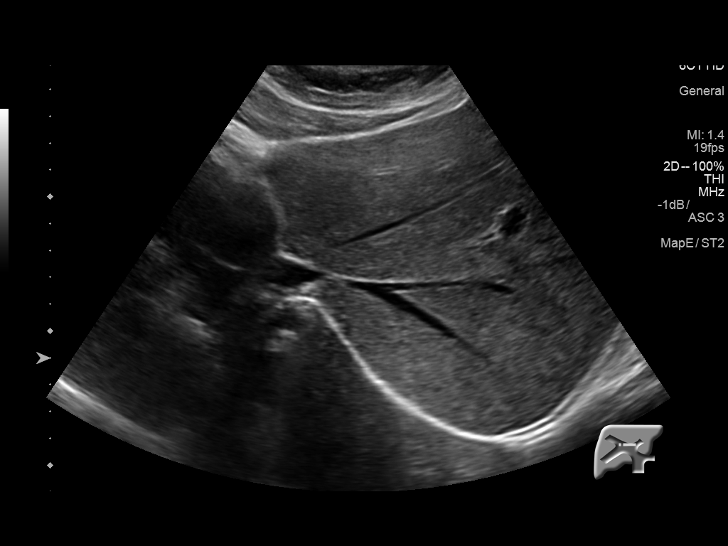
[im 29/29]
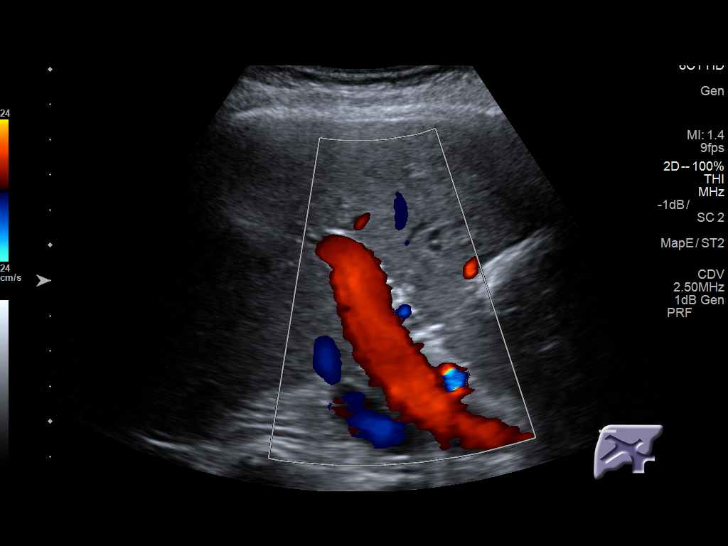

[14 of 25 positions shown; findings below may reference images not displayed]

FINDINGS: Gallbladder:

Small 3 mm nonmobile echogenic focus in the gallbladder fundus
(image 18) probably is an inconsequential polyp. No gallbladder
sludge or shadowing stones. Normal gallbladder wall thickness
elsewhere of 2 mm. No sonographic Murphy sign elicited. No
pericholecystic fluid.

Common bile duct:

Diameter: 3 mm, normal

Liver:

No focal lesion identified. Within normal limits in parenchymal
echogenicity.

Other findings: Negative visible right kidney
IMPRESSION: Negative right upper quadrant ultrasound; inconsequential 3 mm
gallbladder polyp.

## 2018-05-06 ENCOUNTER — Telehealth: Payer: Self-pay | Admitting: Oncology

## 2018-05-06 NOTE — Telephone Encounter (Signed)
FAXED RECORDS TO [place] [phone]
